# Patient Record
Sex: Female | Born: 1985 | Race: White | Hispanic: No | Marital: Married | Smoking: Never smoker
Health system: Southern US, Community
[De-identification: ages and names within clinical notes are randomized; demographics above are authoritative.]

## PROBLEM LIST (undated history)

## (undated) DIAGNOSIS — IMO0002 Reserved for concepts with insufficient information to code with codable children: Secondary | ICD-10-CM

## (undated) DIAGNOSIS — R87619 Unspecified abnormal cytological findings in specimens from cervix uteri: Secondary | ICD-10-CM

## (undated) HISTORY — DX: Reserved for concepts with insufficient information to code with codable children: IMO0002

## (undated) HISTORY — DX: Unspecified abnormal cytological findings in specimens from cervix uteri: R87.619

## (undated) HISTORY — PX: OTHER SURGICAL HISTORY: SHX169

---

## 2004-09-28 ENCOUNTER — Emergency Department (HOSPITAL_COMMUNITY): Admission: EM | Admit: 2004-09-28 | Discharge: 2004-09-28 | Payer: Self-pay

## 2005-11-30 ENCOUNTER — Emergency Department (HOSPITAL_COMMUNITY): Admission: EM | Admit: 2005-11-30 | Discharge: 2005-11-30 | Payer: Self-pay | Admitting: Emergency Medicine

## 2007-05-28 ENCOUNTER — Ambulatory Visit (HOSPITAL_COMMUNITY): Admission: RE | Admit: 2007-05-28 | Discharge: 2007-05-28 | Payer: Self-pay | Admitting: Family Medicine

## 2008-04-01 ENCOUNTER — Encounter: Admission: RE | Admit: 2008-04-01 | Discharge: 2008-04-01 | Payer: Self-pay | Admitting: Family Medicine

## 2008-05-02 ENCOUNTER — Emergency Department (HOSPITAL_COMMUNITY): Admission: EM | Admit: 2008-05-02 | Discharge: 2008-05-02 | Payer: Self-pay | Admitting: Emergency Medicine

## 2008-06-25 IMAGING — US US TRANSVAGINAL NON-OB
1 series · 14 of 25 positions shown · non-contrast
Comparison: None.

CLINICAL DATA: Pelvic pain.  
 TRANSVAGINAL PELVIC ULTRASOUND:
TECHNIQUE: Transvaginal ultrasound examination of the pelvis was performed including evaluation of the uterus, ovaries, adnexal regions, and pelvic cul-de-sac.

[Series 1: unknown · 0.32mm/px · 14 of 46 slices shown]
[im 1/46]
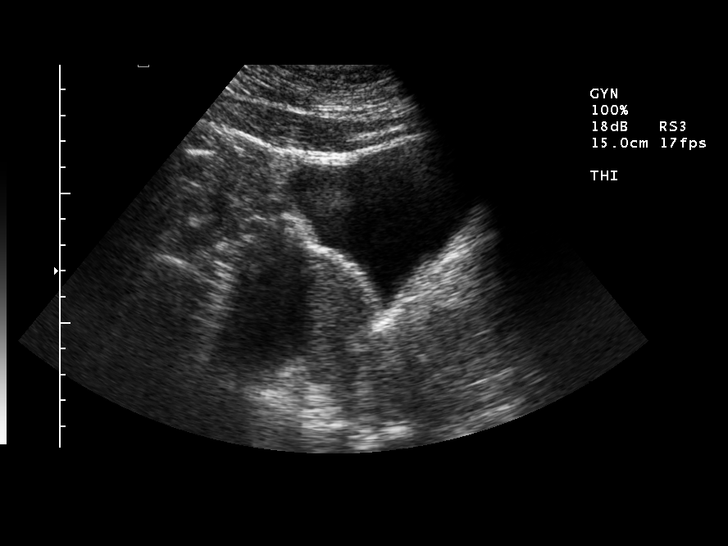
[im 4/46]
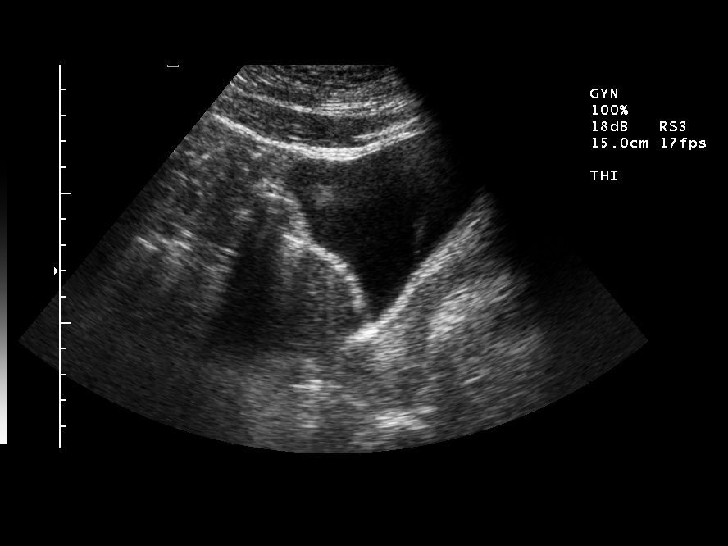
[im 8/46]
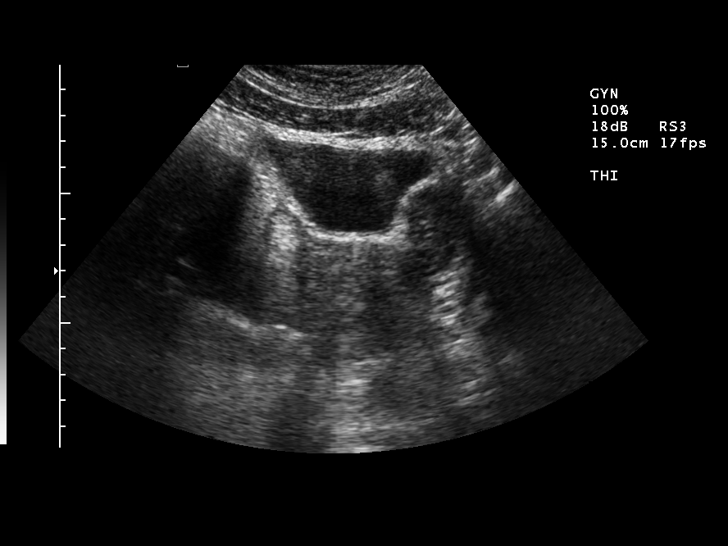
[im 12/46]
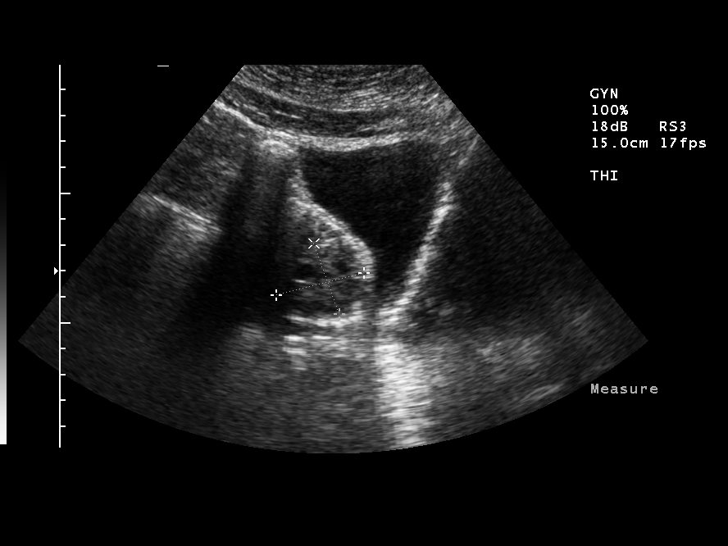
[im 16/46]
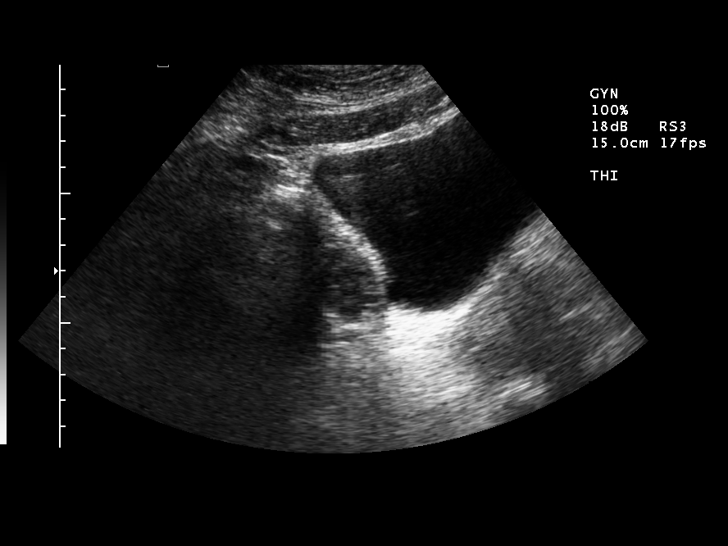
[im 17/46]
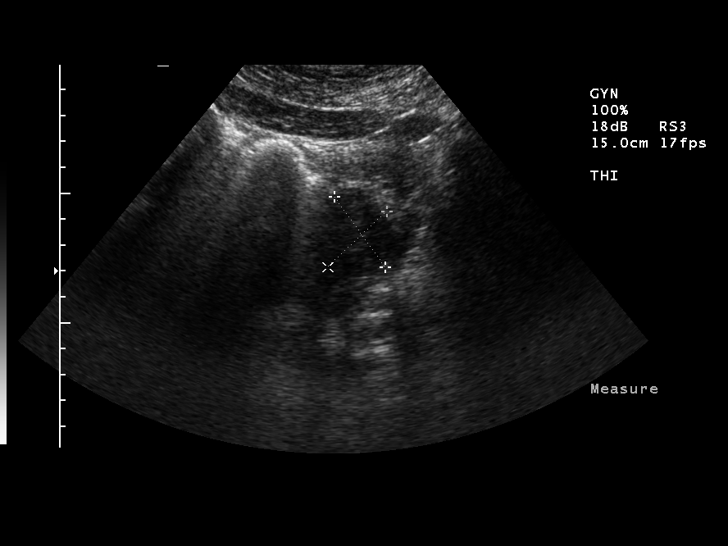
[im 21/46]
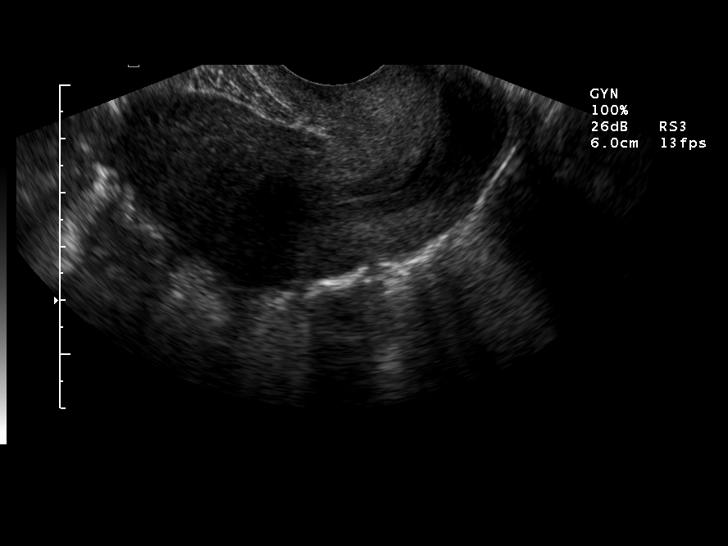
[im 25/46]
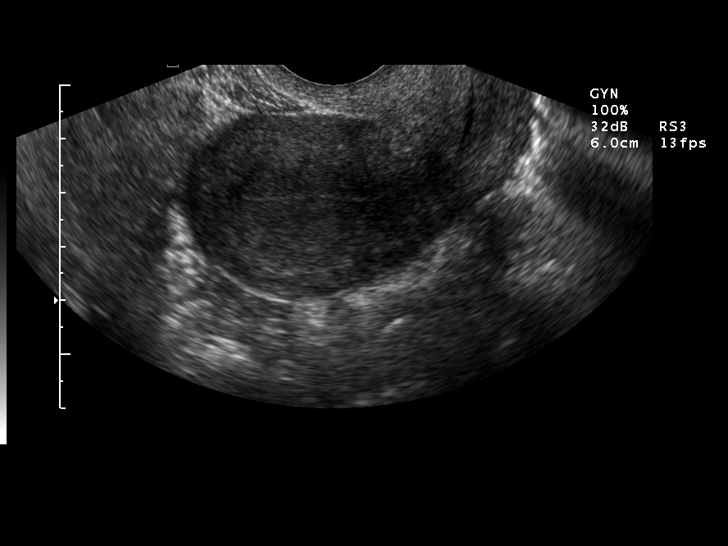
[im 29/46]
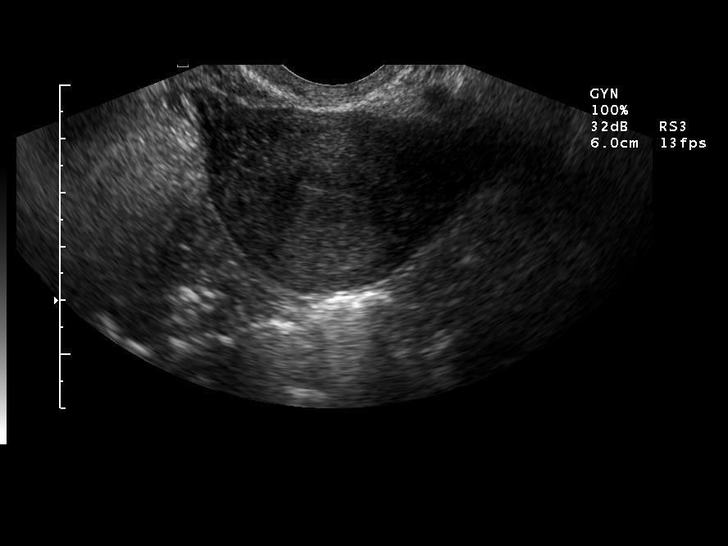
[im 31/46]
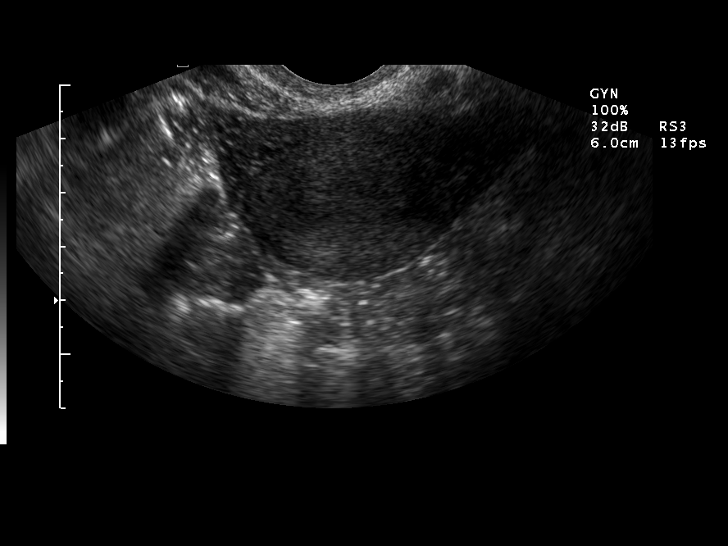
[im 34/46]
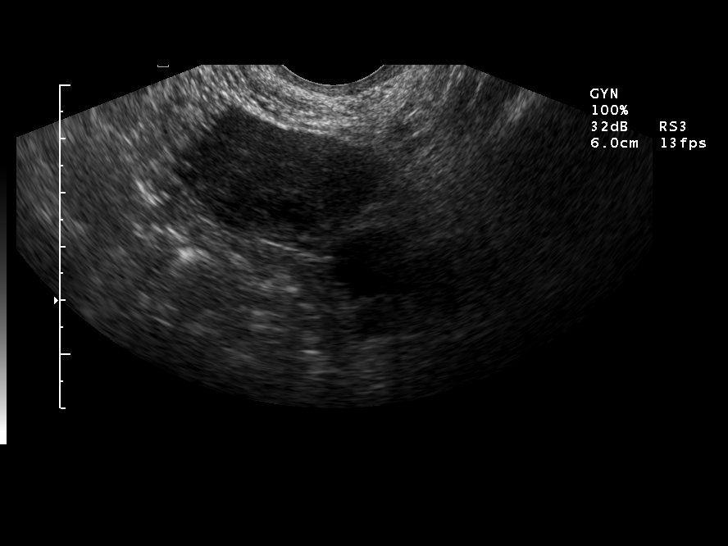
[im 38/46]
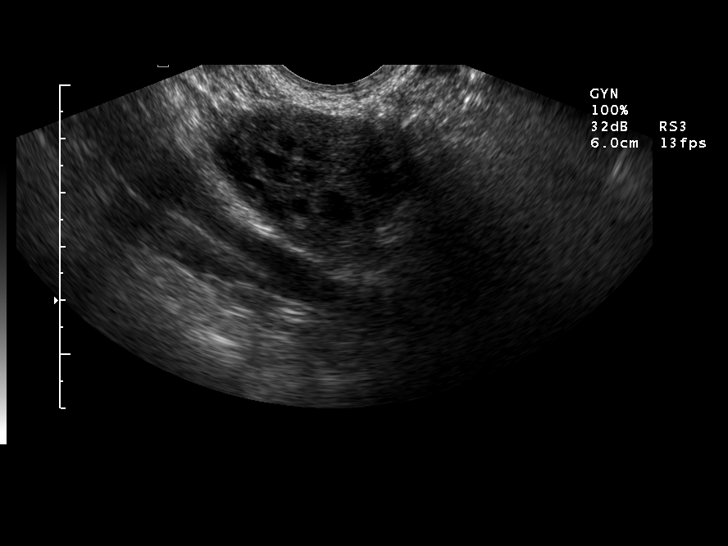
[im 42/46]
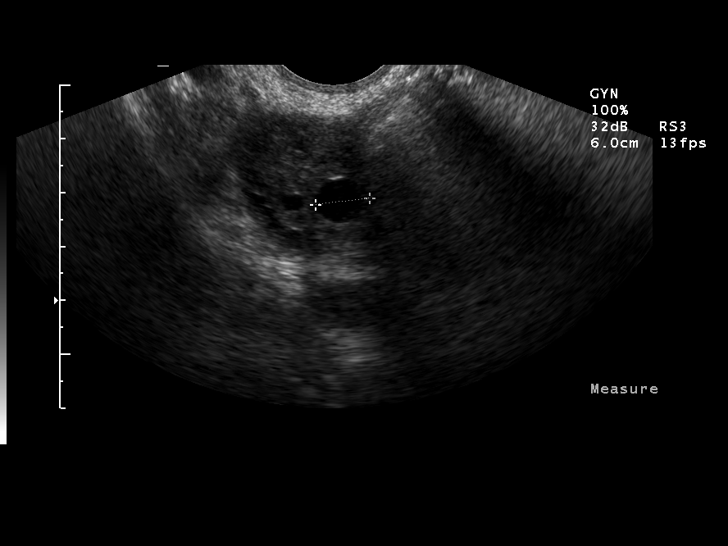
[im 46/46]
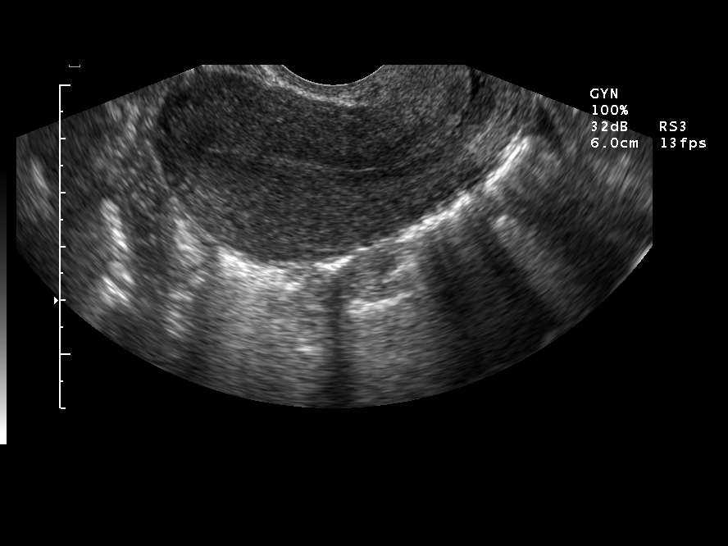

[14 of 25 positions shown; findings below may reference images not displayed]

FINDINGS: The uterine size and contour are normal.  Measurements are 5.4 x 3.6 x 4.6 cm (length x AP x width).  The endometrium is 2 mm and normal in appearance.  The ovaries are normal in size.   The right is 3.5 x 2.5 x 3.3 cm.  The left is 3.5 x 2.0 x 3.2 cm.   There are a few normal ovarian follicles.  No significant cysts or masses.    No pelvic fluid.
IMPRESSION: Normal exam.

## 2008-06-25 IMAGING — US US ABDOMEN COMPLETE
1 series · 14 of 25 positions shown · non-contrast
Comparison: None.

CLINICAL DATA: Abdominal pain radiating to back.
 ABDOMEN ULTRASOUND COMPLETE ? 05/28/07:
TECHNIQUE: Complete abdominal ultrasound examination was performed including evaluation of the liver, gallbladder, bile ducts, pancreas, kidneys, spleen, IVC, and abdominal aorta.

[Series 1: unknown · 0.25mm/px · 14 of 68 slices shown]
[im 1/68]
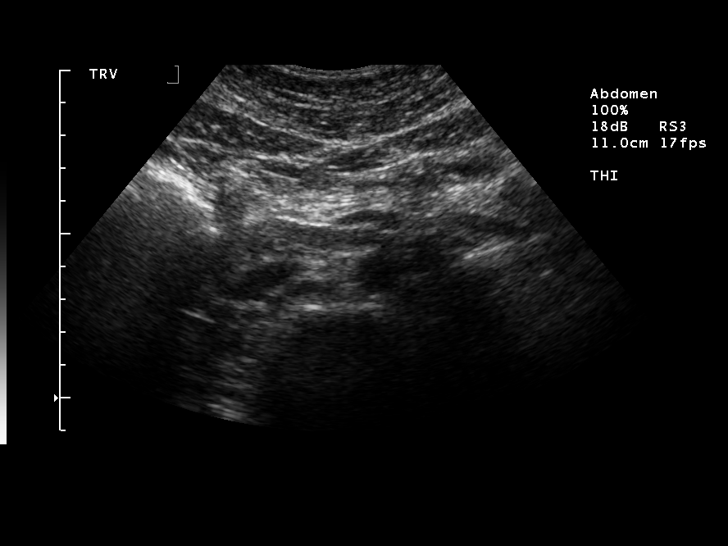
[im 6/68]
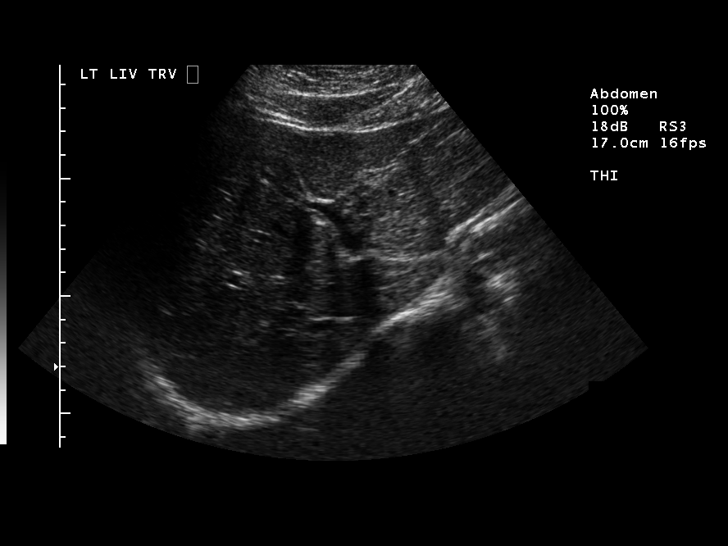
[im 12/68]
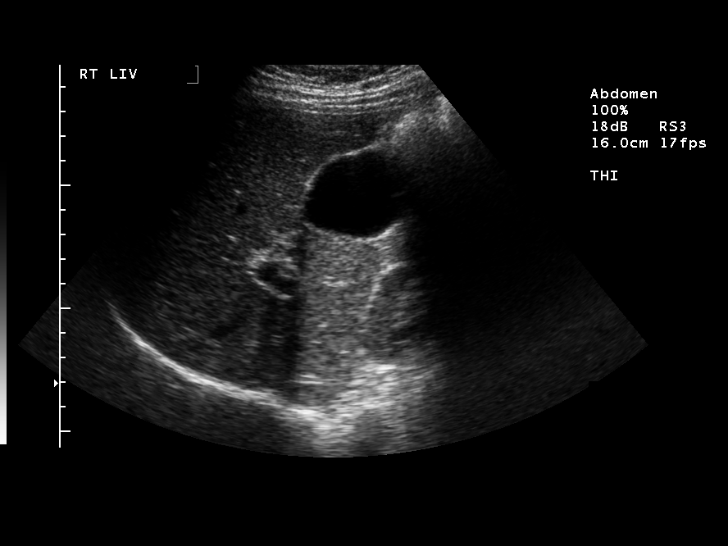
[im 17/68]
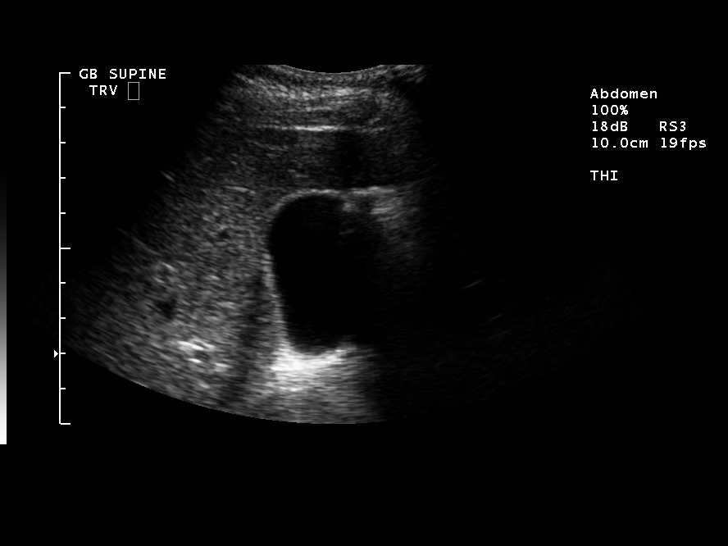
[im 23/68]
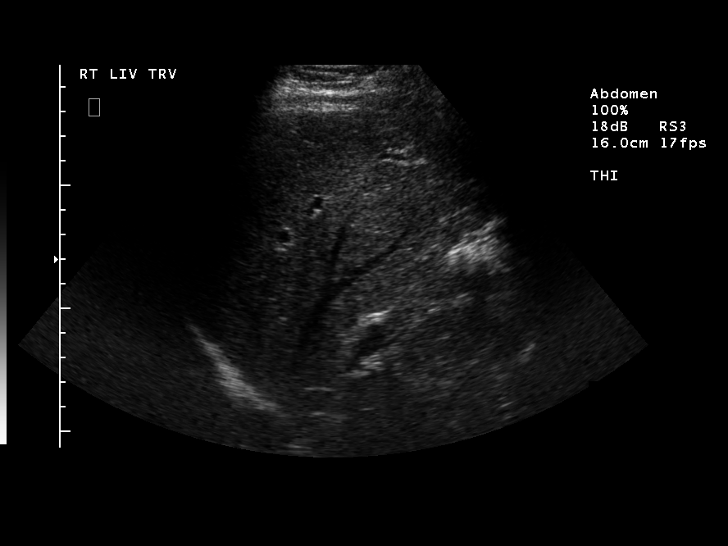
[im 26/68]
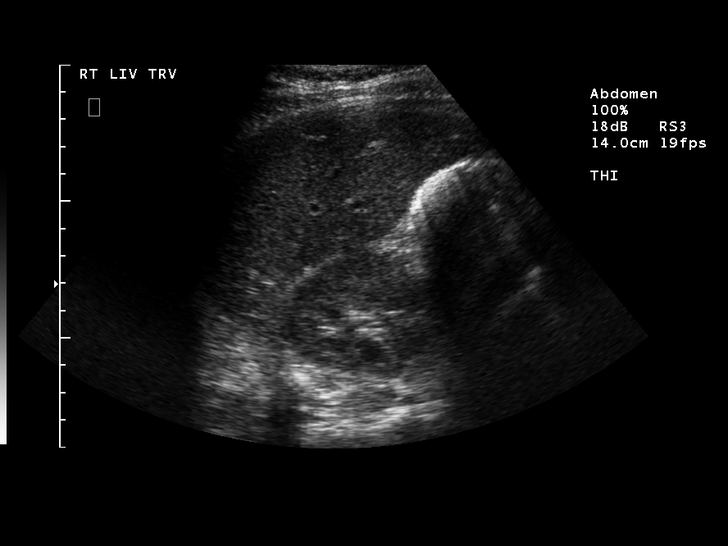
[im 31/68]
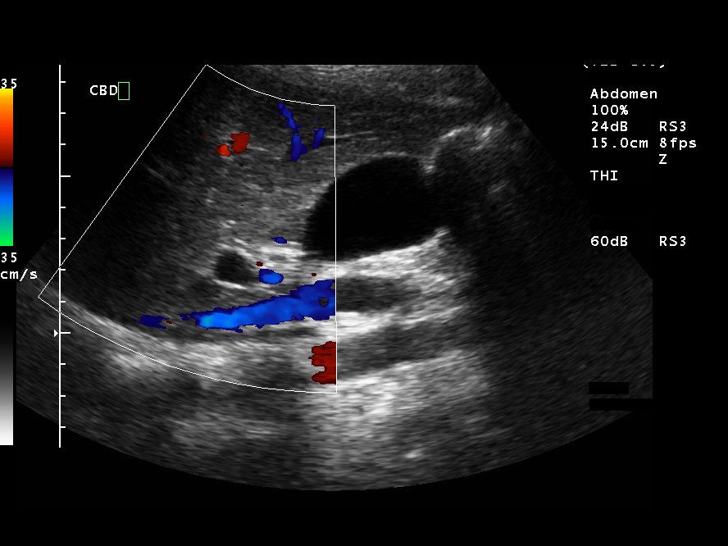
[im 37/68]
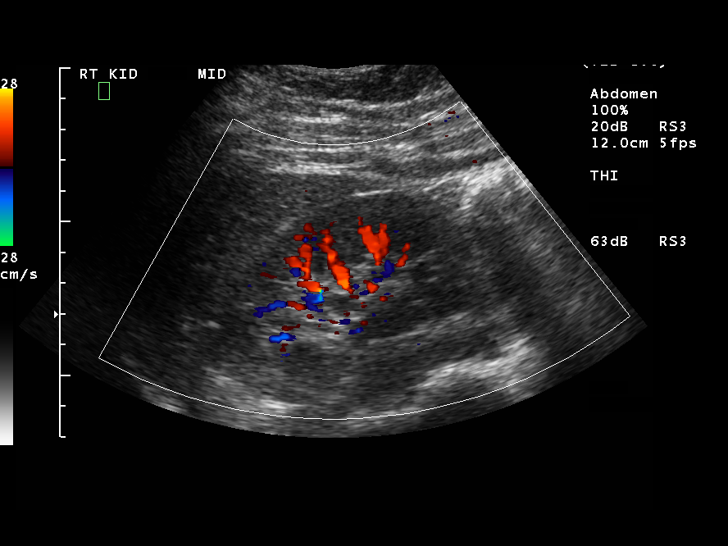
[im 42/68]
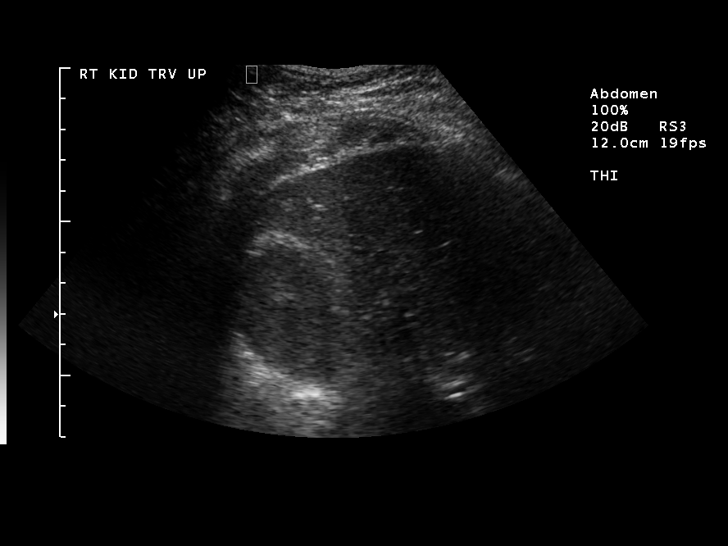
[im 45/68]
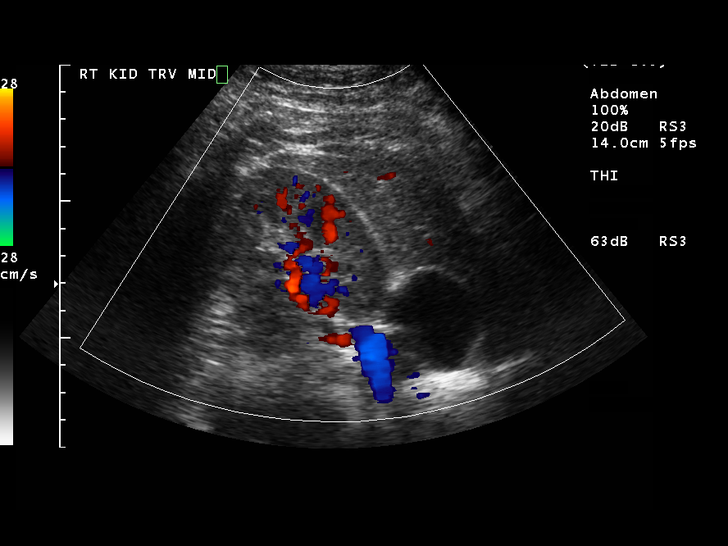
[im 51/68]
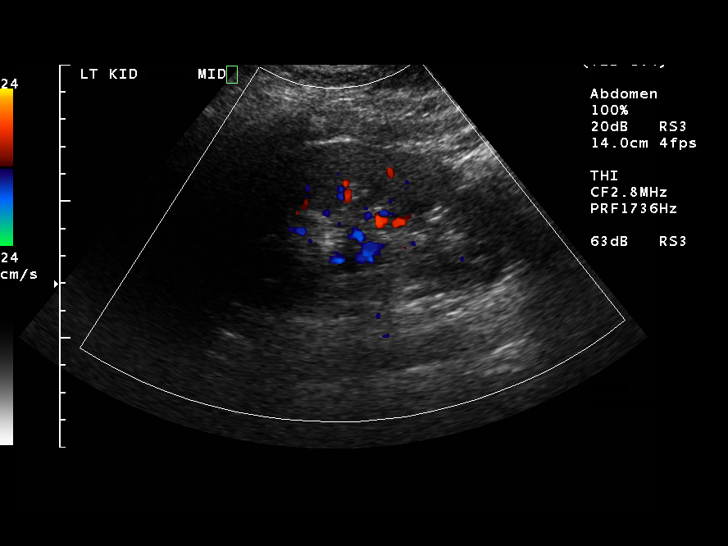
[im 56/68]
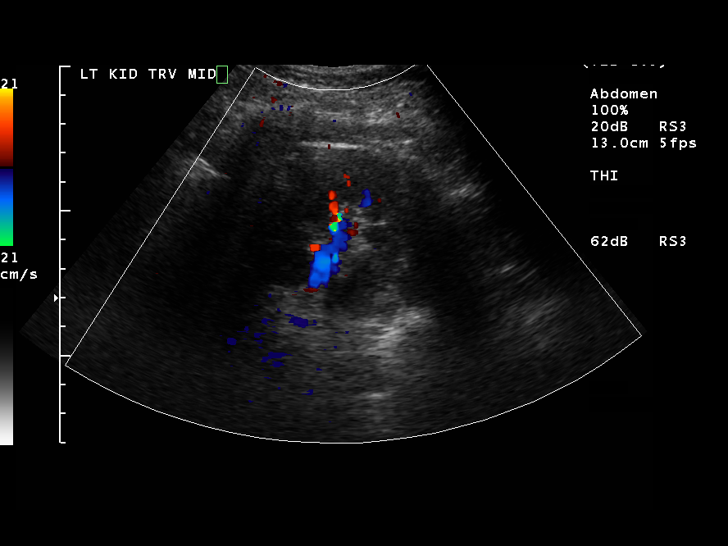
[im 62/68]
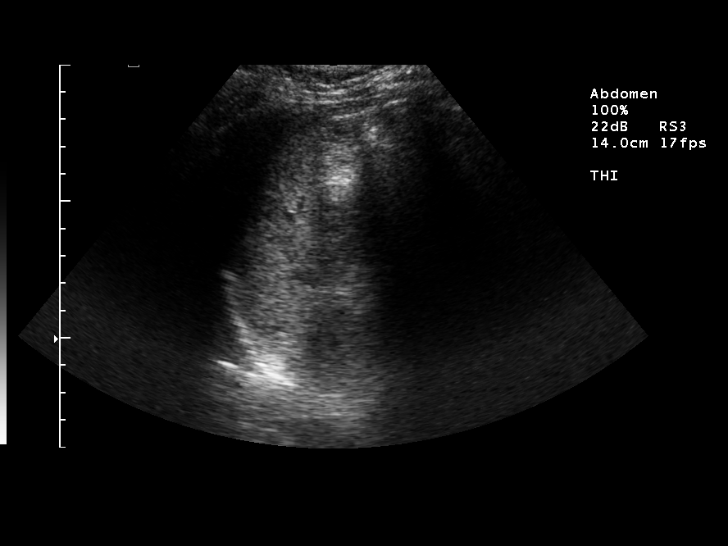
[im 68/68]
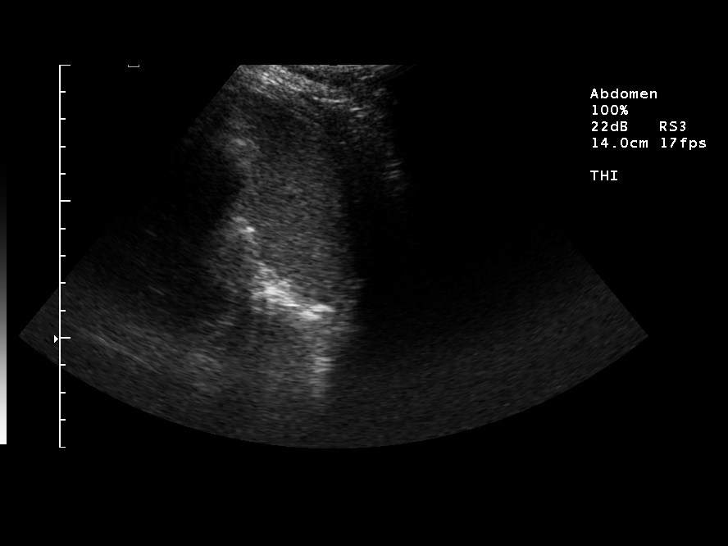

[14 of 25 positions shown; findings below may reference images not displayed]

FINDINGS: The gallbladder and biliary tree are normal.  The common duct is measured at about 2 mm.  The pancreas, liver, and spleen are normal.  The kidneys are normal without stones or obstruction.  Much of the aorta and IVC are obscured by bowel gas.  However, the pancreas is adequately demonstrated and appears normal.
IMPRESSION: 1.  Normal gallbladder and biliary tree.
 2.  No other acute or significant findings.
 3.  Much of the aorta and some of the IVC obscured by bowel gas.

## 2008-10-19 ENCOUNTER — Ambulatory Visit: Payer: Self-pay | Admitting: Family Medicine

## 2008-10-19 DIAGNOSIS — J310 Chronic rhinitis: Secondary | ICD-10-CM

## 2008-10-19 DIAGNOSIS — R51 Headache: Secondary | ICD-10-CM

## 2008-10-19 DIAGNOSIS — R519 Headache, unspecified: Secondary | ICD-10-CM | POA: Insufficient documentation

## 2009-01-31 ENCOUNTER — Encounter: Payer: Self-pay | Admitting: Obstetrics and Gynecology

## 2009-01-31 ENCOUNTER — Ambulatory Visit: Payer: Self-pay | Admitting: Obstetrics & Gynecology

## 2009-01-31 LAB — CONVERTED CEMR LAB
Antibody Screen: NEGATIVE
Basophils Absolute: 0 10*3/uL (ref 0.0–0.1)
Basophils Relative: 0 % (ref 0–1)
Eosinophils Absolute: 0 10*3/uL (ref 0.0–0.7)
Eosinophils Relative: 0 % (ref 0–5)
HCT: 35.8 % — ABNORMAL LOW (ref 36.0–46.0)
Hemoglobin: 11.9 g/dL — ABNORMAL LOW (ref 12.0–15.0)
Hepatitis B Surface Ag: NEGATIVE
Lymphocytes Relative: 25 % (ref 12–46)
Lymphs Abs: 2 10*3/uL (ref 0.7–4.0)
MCHC: 33.2 g/dL (ref 30.0–36.0)
MCV: 84.4 fL (ref 78.0–100.0)
Monocytes Absolute: 0.5 10*3/uL (ref 0.1–1.0)
Monocytes Relative: 6 % (ref 3–12)
Neutro Abs: 5.3 10*3/uL (ref 1.7–7.7)
Neutrophils Relative %: 68 % (ref 43–77)
Platelets: 237 10*3/uL (ref 150–400)
RBC: 4.24 M/uL (ref 3.87–5.11)
RDW: 14.1 % (ref 11.5–15.5)
Rh Type: NEGATIVE
Rubella: 87.5 intl units/mL — ABNORMAL HIGH
WBC: 7.9 10*3/uL (ref 4.0–10.5)

## 2009-02-01 ENCOUNTER — Encounter: Payer: Self-pay | Admitting: Obstetrics and Gynecology

## 2009-02-17 ENCOUNTER — Ambulatory Visit: Payer: Self-pay | Admitting: Family

## 2009-02-17 ENCOUNTER — Encounter: Payer: Self-pay | Admitting: Family

## 2009-03-13 ENCOUNTER — Ambulatory Visit: Payer: Self-pay | Admitting: Obstetrics and Gynecology

## 2009-04-10 ENCOUNTER — Ambulatory Visit: Payer: Self-pay | Admitting: Family

## 2009-05-08 ENCOUNTER — Ambulatory Visit: Payer: Self-pay | Admitting: Obstetrics and Gynecology

## 2009-05-12 ENCOUNTER — Ambulatory Visit (HOSPITAL_COMMUNITY): Admission: RE | Admit: 2009-05-12 | Discharge: 2009-05-12 | Payer: Self-pay | Admitting: Obstetrics & Gynecology

## 2009-05-15 ENCOUNTER — Ambulatory Visit: Payer: Self-pay | Admitting: Family

## 2009-06-05 ENCOUNTER — Ambulatory Visit: Payer: Self-pay | Admitting: Family

## 2009-07-04 ENCOUNTER — Ambulatory Visit: Payer: Self-pay | Admitting: Obstetrics & Gynecology

## 2009-07-05 ENCOUNTER — Encounter: Payer: Self-pay | Admitting: Obstetrics & Gynecology

## 2009-07-05 LAB — CONVERTED CEMR LAB
Antibody Screen: NEGATIVE
HCT: 31.1 % — ABNORMAL LOW (ref 36.0–46.0)
Hemoglobin: 10.3 g/dL — ABNORMAL LOW (ref 12.0–15.0)
MCHC: 33.1 g/dL (ref 30.0–36.0)
MCV: 84.5 fL (ref 78.0–100.0)
Platelets: 254 10*3/uL (ref 150–400)
RBC: 3.68 M/uL — ABNORMAL LOW (ref 3.87–5.11)
RDW: 14.3 % (ref 11.5–15.5)
WBC: 11 10*3/uL — ABNORMAL HIGH (ref 4.0–10.5)

## 2009-07-12 ENCOUNTER — Ambulatory Visit: Payer: Self-pay | Admitting: Obstetrics & Gynecology

## 2009-07-17 ENCOUNTER — Ambulatory Visit: Payer: Self-pay | Admitting: Physician Assistant

## 2009-07-31 ENCOUNTER — Ambulatory Visit: Payer: Self-pay | Admitting: Advanced Practice Midwife

## 2009-08-21 ENCOUNTER — Ambulatory Visit: Payer: Self-pay | Admitting: Physician Assistant

## 2009-09-04 ENCOUNTER — Ambulatory Visit: Payer: Self-pay | Admitting: Advanced Practice Midwife

## 2009-09-05 ENCOUNTER — Encounter: Payer: Self-pay | Admitting: Obstetrics and Gynecology

## 2009-09-05 LAB — CONVERTED CEMR LAB
Chlamydia, Swab/Urine, PCR: NEGATIVE
GC Probe Amp, Urine: NEGATIVE

## 2009-09-12 ENCOUNTER — Ambulatory Visit: Payer: Self-pay | Admitting: Obstetrics & Gynecology

## 2009-09-19 ENCOUNTER — Ambulatory Visit: Payer: Self-pay | Admitting: Obstetrics & Gynecology

## 2009-09-26 ENCOUNTER — Ambulatory Visit: Payer: Self-pay | Admitting: Obstetrics and Gynecology

## 2009-09-26 ENCOUNTER — Inpatient Hospital Stay (HOSPITAL_COMMUNITY): Admission: AD | Admit: 2009-09-26 | Discharge: 2009-09-26 | Payer: Self-pay | Admitting: Obstetrics & Gynecology

## 2009-10-03 ENCOUNTER — Ambulatory Visit: Payer: Self-pay | Admitting: Family Medicine

## 2009-10-05 ENCOUNTER — Encounter: Payer: Self-pay | Admitting: Obstetrics and Gynecology

## 2009-10-05 LAB — CONVERTED CEMR LAB
ALT: 11 units/L (ref 0–35)
AST: 15 units/L (ref 0–37)
Albumin: 3.5 g/dL (ref 3.5–5.2)
Alkaline Phosphatase: 127 units/L — ABNORMAL HIGH (ref 39–117)
BUN: 9 mg/dL (ref 6–23)
CO2: 20 meq/L (ref 19–32)
Calcium: 9 mg/dL (ref 8.4–10.5)
Chloride: 102 meq/L (ref 96–112)
Collection Interval-CRCL: 24 hr
Creatinine 24 HR UR: 1738 mg/24hr (ref 700–1800)
Creatinine Clearance: 175 mL/min — ABNORMAL HIGH (ref 75–115)
Creatinine, Ser: 0.69 mg/dL (ref 0.40–1.20)
Creatinine, Urine: 108.6 mg/dL
Glucose, Bld: 151 mg/dL — ABNORMAL HIGH (ref 70–99)
HCT: 34.9 % — ABNORMAL LOW (ref 36.0–46.0)
Hemoglobin: 11.2 g/dL — ABNORMAL LOW (ref 12.0–15.0)
MCHC: 32.1 g/dL (ref 30.0–36.0)
MCV: 79.3 fL (ref 78.0–100.0)
Platelets: 292 10*3/uL (ref 150–400)
Potassium: 4 meq/L (ref 3.5–5.3)
Protein, Ur: 96 mg/24hr (ref 50–100)
RBC: 4.4 M/uL (ref 3.87–5.11)
RDW: 16 % — ABNORMAL HIGH (ref 11.5–15.5)
Sodium: 135 meq/L (ref 135–145)
Total Bilirubin: 0.6 mg/dL (ref 0.3–1.2)
Total Protein: 6.3 g/dL (ref 6.0–8.3)
WBC: 10.5 10*3/uL (ref 4.0–10.5)

## 2009-10-06 ENCOUNTER — Inpatient Hospital Stay (HOSPITAL_COMMUNITY): Admission: AD | Admit: 2009-10-06 | Discharge: 2009-10-09 | Payer: Self-pay | Admitting: Obstetrics & Gynecology

## 2009-10-06 ENCOUNTER — Ambulatory Visit: Payer: Self-pay | Admitting: Advanced Practice Midwife

## 2009-10-06 ENCOUNTER — Ambulatory Visit: Payer: Self-pay | Admitting: Family

## 2009-10-18 ENCOUNTER — Ambulatory Visit: Payer: Self-pay | Admitting: Obstetrics & Gynecology

## 2009-10-23 ENCOUNTER — Ambulatory Visit: Payer: Self-pay | Admitting: Obstetrics and Gynecology

## 2009-10-25 ENCOUNTER — Ambulatory Visit: Payer: Self-pay | Admitting: Obstetrics & Gynecology

## 2009-11-15 ENCOUNTER — Ambulatory Visit: Payer: Self-pay | Admitting: Obstetrics & Gynecology

## 2010-04-02 ENCOUNTER — Ambulatory Visit: Payer: Self-pay | Admitting: Physician Assistant

## 2010-07-23 ENCOUNTER — Ambulatory Visit: Payer: Self-pay | Admitting: Obstetrics & Gynecology

## 2010-07-23 LAB — CONVERTED CEMR LAB
Antibody Screen: NEGATIVE
Bacteria, UA: NONE SEEN
Basophils Absolute: 0 10*3/uL (ref 0.0–0.1)
Basophils Relative: 0 % (ref 0–1)
Bilirubin Urine: NEGATIVE
Casts: NONE SEEN /lpf
Crystals: NONE SEEN
Eosinophils Absolute: 0.1 10*3/uL (ref 0.0–0.7)
Eosinophils Relative: 1 % (ref 0–5)
HCT: 36.3 % (ref 36.0–46.0)
Hemoglobin, Urine: NEGATIVE
Hemoglobin: 12 g/dL (ref 12.0–15.0)
Hepatitis B Surface Ag: NEGATIVE
Ketones, ur: NEGATIVE mg/dL
Leukocytes, UA: NEGATIVE
Lymphocytes Relative: 22 % (ref 12–46)
Lymphs Abs: 2 10*3/uL (ref 0.7–4.0)
MCHC: 33.1 g/dL (ref 30.0–36.0)
MCV: 81.4 fL (ref 78.0–100.0)
Monocytes Absolute: 0.5 10*3/uL (ref 0.1–1.0)
Monocytes Relative: 6 % (ref 3–12)
Neutro Abs: 6.3 10*3/uL (ref 1.7–7.7)
Neutrophils Relative %: 71 % (ref 43–77)
Nitrite: NEGATIVE
Platelets: 277 10*3/uL (ref 150–400)
Protein, ur: NEGATIVE mg/dL
RBC: 4.46 M/uL (ref 3.87–5.11)
RDW: 14.5 % (ref 11.5–15.5)
Rh Type: NEGATIVE
Rubella: 99.5 intl units/mL — ABNORMAL HIGH
Specific Gravity, Urine: 1.028 (ref 1.005–1.030)
Squamous Epithelial / HPF: NONE SEEN /lpf
Urine Glucose: NEGATIVE mg/dL
Urobilinogen, UA: 0.2 (ref 0.0–1.0)
WBC: 8.9 10*3/uL (ref 4.0–10.5)
pH: 6 (ref 5.0–8.0)

## 2010-08-01 ENCOUNTER — Inpatient Hospital Stay (HOSPITAL_COMMUNITY): Admission: AD | Admit: 2010-08-01 | Discharge: 2010-08-01 | Payer: Self-pay | Admitting: Obstetrics & Gynecology

## 2010-08-20 ENCOUNTER — Encounter: Payer: Self-pay | Admitting: Obstetrics and Gynecology

## 2010-08-20 ENCOUNTER — Ambulatory Visit: Payer: Self-pay | Admitting: Advanced Practice Midwife

## 2010-08-20 LAB — CONVERTED CEMR LAB
Chlamydia, Swab/Urine, PCR: NEGATIVE
GC Probe Amp, Urine: NEGATIVE

## 2010-09-17 ENCOUNTER — Ambulatory Visit: Payer: Self-pay | Admitting: Obstetrics & Gynecology

## 2010-10-04 ENCOUNTER — Ambulatory Visit (HOSPITAL_COMMUNITY)
Admission: RE | Admit: 2010-10-04 | Discharge: 2010-10-04 | Payer: Self-pay | Source: Home / Self Care | Attending: Family Medicine | Admitting: Family Medicine

## 2010-10-04 ENCOUNTER — Encounter: Payer: Self-pay | Admitting: Family Medicine

## 2010-10-15 ENCOUNTER — Ambulatory Visit: Payer: Self-pay | Admitting: Advanced Practice Midwife

## 2010-11-12 ENCOUNTER — Ambulatory Visit
Admission: RE | Admit: 2010-11-12 | Discharge: 2010-11-12 | Payer: Self-pay | Source: Home / Self Care | Attending: Obstetrics & Gynecology | Admitting: Obstetrics & Gynecology

## 2010-11-19 ENCOUNTER — Encounter: Payer: Self-pay | Admitting: Family Medicine

## 2010-12-14 ENCOUNTER — Institutional Professional Consult (permissible substitution): Payer: Self-pay

## 2010-12-14 ENCOUNTER — Encounter: Payer: Self-pay | Admitting: Family

## 2010-12-14 DIAGNOSIS — O361190 Maternal care for Anti-A sensitization, unspecified trimester, not applicable or unspecified: Secondary | ICD-10-CM

## 2010-12-14 DIAGNOSIS — Z3480 Encounter for supervision of other normal pregnancy, unspecified trimester: Secondary | ICD-10-CM

## 2010-12-14 LAB — CONVERTED CEMR LAB
Antibody Screen: NEGATIVE
HCT: 34.3 % — ABNORMAL LOW (ref 36.0–46.0)
Hemoglobin: 10.8 g/dL — ABNORMAL LOW (ref 12.0–15.0)
MCHC: 31.5 g/dL (ref 30.0–36.0)
MCV: 85.8 fL (ref 78.0–100.0)
Platelets: 251 10*3/uL (ref 150–400)
RBC: 4 M/uL (ref 3.87–5.11)
RDW: 15.4 % (ref 11.5–15.5)
WBC: 8.7 10*3/uL (ref 4.0–10.5)

## 2010-12-31 ENCOUNTER — Other Ambulatory Visit: Payer: Self-pay | Admitting: Obstetrics & Gynecology

## 2010-12-31 DIAGNOSIS — N312 Flaccid neuropathic bladder, not elsewhere classified: Secondary | ICD-10-CM

## 2010-12-31 DIAGNOSIS — Z3689 Encounter for other specified antenatal screening: Secondary | ICD-10-CM

## 2010-12-31 DIAGNOSIS — O3430 Maternal care for cervical incompetence, unspecified trimester: Secondary | ICD-10-CM

## 2010-12-31 DIAGNOSIS — R87619 Unspecified abnormal cytological findings in specimens from cervix uteri: Secondary | ICD-10-CM

## 2010-12-31 DIAGNOSIS — Z3480 Encounter for supervision of other normal pregnancy, unspecified trimester: Secondary | ICD-10-CM

## 2011-01-04 ENCOUNTER — Encounter (HOSPITAL_COMMUNITY): Payer: Self-pay

## 2011-01-04 ENCOUNTER — Other Ambulatory Visit: Payer: Self-pay | Admitting: Obstetrics & Gynecology

## 2011-01-04 ENCOUNTER — Ambulatory Visit (HOSPITAL_COMMUNITY)
Admission: RE | Admit: 2011-01-04 | Discharge: 2011-01-04 | Disposition: A | Discharge status: Home / Self Care | Payer: 59 | Source: Ambulatory Visit | Attending: Obstetrics & Gynecology | Admitting: Obstetrics & Gynecology

## 2011-01-04 DIAGNOSIS — O3430 Maternal care for cervical incompetence, unspecified trimester: Secondary | ICD-10-CM

## 2011-01-04 DIAGNOSIS — Z3689 Encounter for other specified antenatal screening: Secondary | ICD-10-CM | POA: Insufficient documentation

## 2011-01-05 ENCOUNTER — Encounter: Payer: Self-pay | Admitting: Physician Assistant

## 2011-01-05 LAB — CONVERTED CEMR LAB
ALT: 8 units/L (ref 0–35)
AST: 10 units/L (ref 0–37)
Albumin: 3.4 g/dL — ABNORMAL LOW (ref 3.5–5.2)
Alkaline Phosphatase: 65 units/L (ref 39–117)
BUN: 9 mg/dL (ref 6–23)
Basophils Absolute: 0 10*3/uL (ref 0.0–0.1)
Basophils Relative: 0 % (ref 0–1)
CO2: 21 meq/L (ref 19–32)
Calcium: 8.7 mg/dL (ref 8.4–10.5)
Chloride: 105 meq/L (ref 96–112)
Collection Interval-CRCL: 24 hr
Creatinine, Ser: 0.63 mg/dL (ref 0.40–1.20)
Creatinine, Urine: 99.9 mg/dL
Eosinophils Absolute: 0 10*3/uL (ref 0.0–0.7)
Glucose, Bld: 103 mg/dL — ABNORMAL HIGH (ref 70–99)
HCT: 34.2 % — ABNORMAL LOW (ref 36.0–46.0)
Hemoglobin: 11 g/dL — ABNORMAL LOW (ref 12.0–15.0)
Lymphocytes Relative: 17 % (ref 12–46)
Lymphs Abs: 1.6 10*3/uL (ref 0.7–4.0)
MCV: 82.6 fL (ref 78.0–100.0)
Monocytes Absolute: 0.6 10*3/uL (ref 0.1–1.0)
Monocytes Relative: 6 % (ref 3–12)
Neutro Abs: 7 10*3/uL (ref 1.7–7.7)
Neutrophils Relative %: 76 % (ref 43–77)
Platelets: 239 10*3/uL (ref 150–400)
Potassium: 4.1 meq/L (ref 3.5–5.3)
RBC: 4.14 M/uL (ref 3.87–5.11)
RDW: 15.8 % — ABNORMAL HIGH (ref 11.5–15.5)
Sodium: 137 meq/L (ref 135–145)
Total Bilirubin: 0.3 mg/dL (ref 0.3–1.2)
Total Protein: 5.6 g/dL — ABNORMAL LOW (ref 6.0–8.3)
Uric Acid, Serum: 2.9 mg/dL (ref 2.4–7.0)
WBC: 9.1 10*3/uL (ref 4.0–10.5)

## 2011-01-10 LAB — URINE MICROSCOPIC-ADD ON

## 2011-01-10 LAB — URINALYSIS, ROUTINE W REFLEX MICROSCOPIC
Bilirubin Urine: NEGATIVE
Glucose, UA: NEGATIVE mg/dL
Ketones, ur: 15 mg/dL — AB
Leukocytes, UA: NEGATIVE
Nitrite: NEGATIVE
Protein, ur: NEGATIVE mg/dL
Specific Gravity, Urine: 1.025 (ref 1.005–1.030)
Urobilinogen, UA: 0.2 mg/dL (ref 0.0–1.0)
pH: 6 (ref 5.0–8.0)

## 2011-01-10 LAB — COMPREHENSIVE METABOLIC PANEL
ALT: 13 U/L (ref 0–35)
AST: 15 U/L (ref 0–37)
Albumin: 3.4 g/dL — ABNORMAL LOW (ref 3.5–5.2)
Alkaline Phosphatase: 51 U/L (ref 39–117)
BUN: 8 mg/dL (ref 6–23)
CO2: 25 mEq/L (ref 19–32)
Calcium: 8.9 mg/dL (ref 8.4–10.5)
Chloride: 101 mEq/L (ref 96–112)
Creatinine, Ser: 0.63 mg/dL (ref 0.4–1.2)
GFR calc Af Amer: >60 mL/min (ref >60)
GFR calc non Af Amer: >60 mL/min (ref >60)
Glucose, Bld: 93 mg/dL (ref 70–99)
Potassium: 4.1 mEq/L (ref 3.5–5.1)
Sodium: 133 mEq/L — ABNORMAL LOW (ref 135–145)
Total Bilirubin: 0.4 mg/dL (ref 0.3–1.2)
Total Protein: 6.2 g/dL (ref 6.0–8.3)

## 2011-01-14 DIAGNOSIS — Z3480 Encounter for supervision of other normal pregnancy, unspecified trimester: Secondary | ICD-10-CM

## 2011-01-14 DIAGNOSIS — R87619 Unspecified abnormal cytological findings in specimens from cervix uteri: Secondary | ICD-10-CM

## 2011-01-14 DIAGNOSIS — N312 Flaccid neuropathic bladder, not elsewhere classified: Secondary | ICD-10-CM

## 2011-01-22 ENCOUNTER — Encounter: Payer: 59 | Admitting: Obstetrics & Gynecology

## 2011-01-22 ENCOUNTER — Other Ambulatory Visit: Payer: Self-pay | Admitting: Family Medicine

## 2011-01-22 DIAGNOSIS — O4190X0 Disorder of amniotic fluid and membranes, unspecified, unspecified trimester, not applicable or unspecified: Secondary | ICD-10-CM

## 2011-01-22 DIAGNOSIS — O169 Unspecified maternal hypertension, unspecified trimester: Secondary | ICD-10-CM

## 2011-01-22 DIAGNOSIS — Z3480 Encounter for supervision of other normal pregnancy, unspecified trimester: Secondary | ICD-10-CM

## 2011-01-25 ENCOUNTER — Other Ambulatory Visit: Payer: Self-pay | Admitting: Family Medicine

## 2011-01-25 ENCOUNTER — Other Ambulatory Visit: Payer: 59

## 2011-01-25 ENCOUNTER — Ambulatory Visit (HOSPITAL_COMMUNITY)
Admission: RE | Admit: 2011-01-25 | Discharge: 2011-01-25 | Disposition: A | Discharge status: Home / Self Care | Payer: 59 | Source: Ambulatory Visit | Attending: Family Medicine | Admitting: Family Medicine

## 2011-01-25 DIAGNOSIS — Z3689 Encounter for other specified antenatal screening: Secondary | ICD-10-CM | POA: Insufficient documentation

## 2011-01-25 DIAGNOSIS — O169 Unspecified maternal hypertension, unspecified trimester: Secondary | ICD-10-CM

## 2011-01-25 DIAGNOSIS — O4190X0 Disorder of amniotic fluid and membranes, unspecified, unspecified trimester, not applicable or unspecified: Secondary | ICD-10-CM

## 2011-01-25 DIAGNOSIS — O0990 Supervision of high risk pregnancy, unspecified, unspecified trimester: Secondary | ICD-10-CM

## 2011-01-25 DIAGNOSIS — O139 Gestational [pregnancy-induced] hypertension without significant proteinuria, unspecified trimester: Secondary | ICD-10-CM | POA: Insufficient documentation

## 2011-01-28 DIAGNOSIS — O169 Unspecified maternal hypertension, unspecified trimester: Secondary | ICD-10-CM

## 2011-01-28 DIAGNOSIS — Z3480 Encounter for supervision of other normal pregnancy, unspecified trimester: Secondary | ICD-10-CM

## 2011-01-29 LAB — COMPREHENSIVE METABOLIC PANEL
ALT: 16 U/L (ref 0–35)
AST: 22 U/L (ref 0–37)
Albumin: 2.8 g/dL — ABNORMAL LOW (ref 3.5–5.2)
Alkaline Phosphatase: 128 U/L — ABNORMAL HIGH (ref 39–117)
BUN: 7 mg/dL (ref 6–23)
CO2: 22 mEq/L (ref 19–32)
Calcium: 9.4 mg/dL (ref 8.4–10.5)
Chloride: 102 mEq/L (ref 96–112)
Creatinine, Ser: 0.6 mg/dL (ref 0.4–1.2)
GFR calc Af Amer: >60 mL/min (ref >60)
GFR calc non Af Amer: >60 mL/min (ref >60)
Glucose, Bld: 89 mg/dL (ref 70–99)
Potassium: 4.3 mEq/L (ref 3.5–5.1)
Sodium: 133 mEq/L — ABNORMAL LOW (ref 135–145)
Total Bilirubin: 0.4 mg/dL (ref 0.3–1.2)
Total Protein: 5.9 g/dL — ABNORMAL LOW (ref 6.0–8.3)

## 2011-01-29 LAB — RH IMMUNE GLOB WKUP(>/=20WKS)(NOT WOMEN'S HOSP): Fetal Screen: NEGATIVE

## 2011-01-29 LAB — CBC
HCT: 33.7 % — ABNORMAL LOW (ref 36.0–46.0)
Hemoglobin: 11 g/dL — ABNORMAL LOW (ref 12.0–15.0)
MCHC: 32.7 g/dL (ref 30.0–36.0)
MCV: 78.8 fL (ref 78.0–100.0)
Platelets: 356 10*3/uL (ref 150–400)
RBC: 4.27 MIL/uL (ref 3.87–5.11)
RDW: 17.2 % — ABNORMAL HIGH (ref 11.5–15.5)
WBC: 9.8 10*3/uL (ref 4.0–10.5)

## 2011-01-29 LAB — RPR: RPR Ser Ql: NONREACTIVE

## 2011-01-31 ENCOUNTER — Other Ambulatory Visit: Payer: 59

## 2011-01-31 DIAGNOSIS — O169 Unspecified maternal hypertension, unspecified trimester: Secondary | ICD-10-CM

## 2011-02-01 ENCOUNTER — Ambulatory Visit (HOSPITAL_COMMUNITY)
Admission: RE | Admit: 2011-02-01 | Discharge: 2011-02-01 | Disposition: A | Discharge status: Home / Self Care | Payer: 59 | Source: Ambulatory Visit | Attending: Family Medicine | Admitting: Family Medicine

## 2011-02-01 DIAGNOSIS — O4190X0 Disorder of amniotic fluid and membranes, unspecified, unspecified trimester, not applicable or unspecified: Secondary | ICD-10-CM

## 2011-02-01 DIAGNOSIS — O139 Gestational [pregnancy-induced] hypertension without significant proteinuria, unspecified trimester: Secondary | ICD-10-CM | POA: Insufficient documentation

## 2011-02-04 ENCOUNTER — Inpatient Hospital Stay (HOSPITAL_COMMUNITY)
Admission: AD | Admit: 2011-02-04 | Discharge: 2011-02-04 | Disposition: A | Discharge status: Home / Self Care | Payer: 59 | Source: Ambulatory Visit | Attending: Family Medicine | Admitting: Family Medicine

## 2011-02-04 ENCOUNTER — Inpatient Hospital Stay (HOSPITAL_COMMUNITY): Payer: 59

## 2011-02-04 DIAGNOSIS — O169 Unspecified maternal hypertension, unspecified trimester: Secondary | ICD-10-CM

## 2011-02-04 DIAGNOSIS — Z3480 Encounter for supervision of other normal pregnancy, unspecified trimester: Secondary | ICD-10-CM

## 2011-02-04 DIAGNOSIS — O368390 Maternal care for abnormalities of the fetal heart rate or rhythm, unspecified trimester, not applicable or unspecified: Secondary | ICD-10-CM | POA: Insufficient documentation

## 2011-02-07 ENCOUNTER — Other Ambulatory Visit: Payer: 59

## 2011-02-07 ENCOUNTER — Inpatient Hospital Stay (HOSPITAL_COMMUNITY)
Admission: AD | Admit: 2011-02-07 | Discharge: 2011-02-07 | Disposition: A | Discharge status: Home / Self Care | Payer: 59 | Source: Ambulatory Visit | Attending: Obstetrics & Gynecology | Admitting: Obstetrics & Gynecology

## 2011-02-07 DIAGNOSIS — O228X9 Other venous complications in pregnancy, unspecified trimester: Secondary | ICD-10-CM | POA: Insufficient documentation

## 2011-02-07 DIAGNOSIS — K649 Unspecified hemorrhoids: Secondary | ICD-10-CM | POA: Insufficient documentation

## 2011-02-07 DIAGNOSIS — O10019 Pre-existing essential hypertension complicating pregnancy, unspecified trimester: Secondary | ICD-10-CM | POA: Insufficient documentation

## 2011-02-08 ENCOUNTER — Ambulatory Visit (HOSPITAL_COMMUNITY): Payer: 59

## 2011-02-11 ENCOUNTER — Ambulatory Visit (HOSPITAL_COMMUNITY)
Admission: RE | Admit: 2011-02-11 | Discharge: 2011-02-11 | Disposition: A | Discharge status: Home / Self Care | Payer: 59 | Source: Ambulatory Visit | Attending: Family Medicine | Admitting: Family Medicine

## 2011-02-11 DIAGNOSIS — O169 Unspecified maternal hypertension, unspecified trimester: Secondary | ICD-10-CM

## 2011-02-11 DIAGNOSIS — Z3689 Encounter for other specified antenatal screening: Secondary | ICD-10-CM | POA: Insufficient documentation

## 2011-02-11 DIAGNOSIS — O4190X0 Disorder of amniotic fluid and membranes, unspecified, unspecified trimester, not applicable or unspecified: Secondary | ICD-10-CM

## 2011-02-11 DIAGNOSIS — O139 Gestational [pregnancy-induced] hypertension without significant proteinuria, unspecified trimester: Secondary | ICD-10-CM | POA: Insufficient documentation

## 2011-02-11 DIAGNOSIS — Z3480 Encounter for supervision of other normal pregnancy, unspecified trimester: Secondary | ICD-10-CM

## 2011-02-13 ENCOUNTER — Other Ambulatory Visit: Payer: Self-pay | Admitting: Family Medicine

## 2011-02-13 DIAGNOSIS — I10 Essential (primary) hypertension: Secondary | ICD-10-CM

## 2011-02-14 ENCOUNTER — Other Ambulatory Visit: Payer: 59

## 2011-02-14 DIAGNOSIS — O169 Unspecified maternal hypertension, unspecified trimester: Secondary | ICD-10-CM

## 2011-02-18 ENCOUNTER — Ambulatory Visit (HOSPITAL_COMMUNITY)
Admission: RE | Admit: 2011-02-18 | Discharge: 2011-02-18 | Disposition: A | Discharge status: Home / Self Care | Payer: 59 | Source: Ambulatory Visit | Attending: Family Medicine | Admitting: Family Medicine

## 2011-02-18 DIAGNOSIS — I10 Essential (primary) hypertension: Secondary | ICD-10-CM

## 2011-02-18 DIAGNOSIS — O139 Gestational [pregnancy-induced] hypertension without significant proteinuria, unspecified trimester: Secondary | ICD-10-CM | POA: Insufficient documentation

## 2011-02-18 DIAGNOSIS — Z3480 Encounter for supervision of other normal pregnancy, unspecified trimester: Secondary | ICD-10-CM

## 2011-02-18 DIAGNOSIS — Z3689 Encounter for other specified antenatal screening: Secondary | ICD-10-CM | POA: Insufficient documentation

## 2011-02-18 DIAGNOSIS — O169 Unspecified maternal hypertension, unspecified trimester: Secondary | ICD-10-CM

## 2011-02-21 ENCOUNTER — Other Ambulatory Visit (INDEPENDENT_AMBULATORY_CARE_PROVIDER_SITE_OTHER): Payer: 59

## 2011-02-21 DIAGNOSIS — O169 Unspecified maternal hypertension, unspecified trimester: Secondary | ICD-10-CM

## 2011-02-25 ENCOUNTER — Inpatient Hospital Stay (HOSPITAL_COMMUNITY)
Admission: RE | Admit: 2011-02-25 | Discharge: 2011-02-27 | DRG: 774 | Disposition: A | Discharge status: Home / Self Care | Payer: 59 | Source: Ambulatory Visit | Attending: Obstetrics & Gynecology | Admitting: Obstetrics & Gynecology

## 2011-02-25 DIAGNOSIS — O1002 Pre-existing essential hypertension complicating childbirth: Principal | ICD-10-CM | POA: Diagnosis present

## 2011-02-25 LAB — CBC
HCT: 34.9 % — ABNORMAL LOW (ref 36.0–46.0)
MCH: 26 pg (ref 26.0–34.0)
MCV: 80.2 fL (ref 78.0–100.0)
RDW: 16.1 % — ABNORMAL HIGH (ref 11.5–15.5)
WBC: 8.8 10*3/uL (ref 4.0–10.5)

## 2011-02-26 DIAGNOSIS — O1002 Pre-existing essential hypertension complicating childbirth: Secondary | ICD-10-CM

## 2011-03-04 LAB — RH IMMUNE GLOB WKUP(>/=20WKS)(NOT WOMEN'S HOSP)
Fetal Screen: NEGATIVE
Unit division: 0

## 2011-03-12 NOTE — Assessment & Plan Note (Signed)
NAMENERIYAH, CERCONE           ACCOUNT NO.:  [id number]   MEDICAL RECORD NO.:  [id number]          PATIENT TYPE:  POB   LOCATION:  [facility] at [place]         FACILITY:  [facility]   PHYSICIAN:  Elsie Lincoln, MD      DATE OF BIRTH:  03/01/1986   DATE OF SERVICE:  10/18/2009                                  CLINIC NOTE   The patient is a 25 year old female, who had an NSVD as a 8-1/2 pound  infant about a week and half ago.  The patient has secondary laceration.  The patient's postpartum course in the hospital was complicated with  unable to urinate after the Foley was removed.  She was cast in and out  once at the hospital.  She was able to void after that.  The patient has  subsequently had to urinate at home and this morning she was not able to  urinate since she has awaken.  Also of note, she says she has had foul-  smelling discharge and which she feels as lumps inside of her labia.  She also had a fever Monday of 102, but none since today.  Her  temperature is 97 degrees.  She denies any breast pain, chest pain,  shortness of breath.  She has not had sexual intercourse.  She has not  had any major heavy vaginal bleeding.  The patient says breast-feeding  is going well.   PHYSICAL EXAMINATION:  VITAL SIGNS:  Temperature 97, pulse 91, blood  pressure 131/84, weight 205, height 64 inches.  GENERAL:  Well-nourished, well-developed, no apparent distress.  HEENT:  Normocephalic, atraumatic.  BREASTS:  Nontender.  No erythema and no nodularity.  Nipples no cracks  or evidence of yeast.  ABDOMEN:  Soft, nontender.  Fundus slightly above umbilicus.  GENITALIA:  The laceration repair has broken down slightly on the right  hand side.  There are portions of the Vicryl that can be seen.  The  sphincter is intact.  The patient is slightly tender in this area, where  the laceration is broken down.  There is no bleeding.  There is no  evidence of active infection.  I do see some very small  bumps on the  labia, but these do not resemble herpes.  She is not tender over this  area.  The vagina is pink with some watery discharge in the vagina with  some odor.  The cervix is closed.  No laceration.  Uterus is slightly  more enlarged then I was thought we could have postpartum, but due to  the bladder being enlarged, the patient is nontender over the uterus.  No CMT.  The patient is mildly tender over the enlarged bladder.   ASSESSMENT AND PLAN:  A 25 year old female postpartum we can have with:  1. A postvoiding residual of greater than 500 mL.  The patient has an      indwelling catheter and will keep this until for 5 days.  At this      time, she will return to the [facility], and we will      remove the Foley and hopefully the bladder will have rested and she      will be  able to void on her own.  2. We will treat empirically for mild endometritis and also possible      early infection at this broken down laceration.  We will treat with      Augmentin 875 mg p.o. b.i.d.  This will also serve as antimicrobial      prophylaxis with an indwelling catheter.  3. Constipation.  The patient is on MiraLax and Colace.  The patient      should continue this and will cut back as necessary as Augmentin      may cause diarrhea.  4. Continue breast-feeding.  Augmentin is compatible with breast-      feeding.  5. The patient given signs for infection and when to call the office      or come to the MAU as necessary.           ______________________________  Elsie Lincoln, MD     KL/MEDQ  D:  10/18/2009  T:  10/19/2009  Job:  [id number]

## 2011-03-12 NOTE — Assessment & Plan Note (Signed)
[name], [name]           ACCOUNT NO.:  [id number]   MEDICAL RECORD NO.:  [id number]          PATIENT TYPE:  POB   LOCATION:  [facility] at [place]         FACILITY:  [facility]   PHYSICIAN:  Allie Bossier, MD        DATE OF BIRTH:  Dec 24, 1985   DATE OF SERVICE:  10/25/2009                                  CLINIC NOTE   [name] is a 25 year old G1, P1 with a 74-1/2-week-old baby.  She is  here for followup of her second-degree laceration and urinary  difficulties that required placement of a Foley catheter.  She has had  her catheter out for the last 2 days.  She has finished her Augmentin  today and has no complaints.  She is now having normal bowel movements  and is able to void.  She occasionally does have to get in the shower  and use the warm water on the abdomen technique.  However, she has no  complaints.  She says the soreness is resolving, and she is doing well.  I will see her back in 4 weeks.      Allie Bossier, MD     MCD/MEDQ  D:  10/25/2009  T:  10/26/2009  Job:  [id number]

## 2011-03-12 NOTE — Assessment & Plan Note (Signed)
[name], [name]           ACCOUNT NO.:  [id number]   MEDICAL RECORD NO.:  [id number]          PATIENT TYPE:  POB   LOCATION:  [facility] [place]         FACILITY:  [facility]   PHYSICIAN:  Maylon Cos, CNM    DATE OF BIRTH:  03-Jun-1986   DATE OF SERVICE:  04/02/2010                                  CLINIC NOTE   REASON FOR TODAY'S VISIT:  Annual exam and Pap smear.   HISTORY OF PRESENT ILLNESS:  The patient is a 25 year old gravida 1,  para 1-0-0-1 who had a normal spontaneous vaginal delivery of 8.5 pound  infant in December 2010, by our practice who presents today with no  complaints.  She is desiring a physical and Pap smear.  She is currently  using condoms for contraception and does not desire to change to a  hormonal birth control.  First day of her last menstrual period was Mar 11, 2010.   MEDICATIONS LIST:  She has no known drug allergies and is currently  continuing to take her concept DHA prenatal vitamins.   PAST MEDICAL HISTORY:  GYN history is positive for an abnormal Pap smear  in 2008, when she had a colpo and cryosurgery with CIN-2.  She has no  history of sexually transmitted infections.   PERSONAL MEDICAL HISTORY:  Negative.   FAMILY HISTORY:  Positive for chronic hypertension, COPD, diabetes, and  thyroid disease.   SURGICAL HISTORY:  Negative.   SOCIAL HISTORY:  She lives with her husband and her new baby and works  as a Museum/gallery exhibitions officer at [employer].  She is a nonsmoker.  She has not used any  illicit drugs and she denies history or current complaints of physical  or sexual abuse.   PHYSICAL EXAMINATION:  GENERAL:  Loyalty is a pleasant 25 year old  Caucasian female who appears to be in no apparent distress.  VITAL SIGNS:  Her vital signs are within normal limits.  Her pulse is  84.  Her blood pressure is 149/83.  Her weight today is 188.  Her height  is 64 inches.  HEENT:  Grossly normal.  NECK:  No lymphadenopathy.  Thyroid is soft and mobile  with no nodules  or lesions.  HEART:  Regular rate and rhythm without murmurs or bruits.  LUNGS:  Clear to auscultation bilaterally AMP.  BACK:  She has no CVA tenderness.  BREASTS:  Symmetrical bilateral.  There are no masses.  She does have  irregular striae.  They are nontender.  There is no dimpling or  retraction of the skin.  Nipples are erect without discharge.  ABDOMEN:  Irregular striae.  No masses, nontender.  LYMPH:  No lymphadenopathy.  No inguinal hernias.  Her femoral pulses  are equal and bounding bilaterally.  SKIN:  Intact, warm to touch with even hair distribution.  GENITALIA:  She is a Tanner V.  External genitalia are without lesions.  Mucous membranes are pink with irregular rugae and good tone.  Cervix is  nonfriable.  On exam, there is a scant amount of creamy discharge that  is nonodorous.  Bimanual exam reveals nonenlarged uterus that is  nontender.  Adnexa are not enlarged and nontender.  EXTREMITIES:  Warm to touch without edema.  Pedal pulses are equal.  She  has no calf pain and reflexes are equal and within normal limits on both  sides.   ASSESSMENT:  1. Well woman.  2. Health maintenance.   PLAN:  1. Pap smear were obtained and sent.  The patient is encouraged to      continue her diet and exercise regimen and to follow up annually      for physicals.  2. The patient should continue her daily multivitamins or prenatal      vitamins.   FOLLOWUP:  The patient should follow up annually or p.r.n. problems.           ______________________________  Maylon Cos, CNM     SS/MEDQ  D:  05/04/2010  T:  05/04/2010  Job:  [id number]

## 2011-03-12 NOTE — Assessment & Plan Note (Signed)
NAMEJAX, [name]           ACCOUNT NO.:  [id number]   MEDICAL RECORD NO.:  [id number]          PATIENT TYPE:  POB   LOCATION:  [facility] [place]         FACILITY:  [facility]   PHYSICIAN:  Elsie Lincoln, MD      DATE OF BIRTH:  14-Aug-1986   DATE OF SERVICE:                                  CLINIC NOTE   The patient is a 25 year old female who presents for a postpartum exam.  The patient had previously been seen here for atonic bladder after her  NSVD.  She wore Foley for a week and then was able to void easily.  She  also had questionably infected laceration that is now resolved after  taking Augmentin.  She feels remarkably better.  She is breastfeeding  well, has no postpartum depression.   PHYSICAL EXAMINATION:  BREASTS:  Soft, nonengorge, no evidence of  mastitis.  ABDOMEN:  Soft, nontender.  No organomegaly.  No hernia.  VITAL SIGNS:  Stable.  Pulse 90, blood pressure 127/88, weight 196,  height 64 inches.  PELVIC:  Genitalia, Tanner V.  Vagina, pink, normal rugae.  Cervix  closed, nontender.  Uterus anteverted, nontender.  EXTREMITIES:  No  edema.   ASSESSMENT AND PLAN:  A 25 year old female, postpartum after a NSVD with  female infant, doing well, breastfeeding with no signs of postpartum  depression.  Atonic bladder resolved.  The patient is on Micronor and  again advised to take the pill the same time every day.  When she is  done breastfeeding, she can go back on Ortho Tri-Cyclen.  The patient  may use condoms as backup when she starts ovulating.  The patient to  come in May for her annual exam.           ______________________________  Elsie Lincoln, MD     KL/MEDQ  D:  11/15/2009  T:  11/16/2009  Job:  [id number]

## 2011-03-13 ENCOUNTER — Encounter: Payer: Self-pay | Admitting: *Deleted

## 2011-04-09 ENCOUNTER — Ambulatory Visit (HOSPITAL_COMMUNITY)
Admission: RE | Admit: 2011-04-09 | Discharge: 2011-04-09 | Disposition: A | Discharge status: Home / Self Care | Payer: 59 | Source: Ambulatory Visit | Attending: Obstetrics & Gynecology | Admitting: Obstetrics & Gynecology

## 2011-04-10 ENCOUNTER — Ambulatory Visit (INDEPENDENT_AMBULATORY_CARE_PROVIDER_SITE_OTHER): Payer: 59 | Admitting: Obstetrics & Gynecology

## 2011-04-12 ENCOUNTER — Ambulatory Visit: Payer: 59

## 2011-04-18 ENCOUNTER — Encounter: Payer: Self-pay | Admitting: *Deleted

## 2011-04-19 ENCOUNTER — Ambulatory Visit (INDEPENDENT_AMBULATORY_CARE_PROVIDER_SITE_OTHER): Payer: 59

## 2011-04-19 ENCOUNTER — Other Ambulatory Visit: Payer: Self-pay | Admitting: Obstetrics and Gynecology

## 2011-04-19 DIAGNOSIS — Z1272 Encounter for screening for malignant neoplasm of vagina: Secondary | ICD-10-CM

## 2011-04-19 DIAGNOSIS — Z113 Encounter for screening for infections with a predominantly sexual mode of transmission: Secondary | ICD-10-CM

## 2011-04-22 NOTE — Assessment & Plan Note (Unsigned)
NAMECLIO, [name]           ACCOUNT NO.:  [id number]  MEDICAL RECORD NO.:  [id number]           PATIENT TYPE:  LOCATION:  [facility]           FACILITY:  PHYSICIAN:  Caren Griffins, CNM       DATE OF BIRTH:  08/22/1986  DATE OF SERVICE:  04/19/2011                                 CLINIC NOTE  HISTORY:  [name] is a G2, P 2-0-0-2 who had an uncomplicated NSVD 7 weeks ago of a female weighing 7 pounds 7 ounces.  She is thriving and now weighs 10 pounds.  She is breast feeding without supplementation and works 5 hours a day and pumping at work.  Her pregravid weight was 190. She is doing some walking for exercising.  She is getting adequate sleep and denies any postpartum depression.  She has the Ortho Evra patch and has just gotten her prescription filled for that.  Last Pap smear was normal 1 year ago.  She has a history of abnormal Pap with cryo but believes she has had two 2-3 normal Paps since then, it is not certain. She is considering having another baby in a year or so.  So, if she is not liking the Pap, she will be changing to the NuvaRing.  OBJECTIVE:  VITAL SIGNS:  BP 122/88, weight 207, height 5 feet 4 inches, and pulse 81. GENERAL:  WN/WD and NAD. BREASTS:  Lactational. ABDOMEN:  Mild diastasis, well involuted.  No tenderness or masses. PELVIC:  NEFG.  Fairly good tone and support.  Vagina rugated.  Cervix multiparous.  Scant amount of brownish discharge.  Of note, she is sexually active and has had no red bleeding for some time but continues to have scant brown spotting.  Pap smear was done.  Bimanual:  Uterus ULNS, nontender, mobile.  No adnexal tenderness or masses. EXTREMITIES:  Without edema.  ASSESSMENT:  Normal postpartum exam.  Followup will be pending result of the Pap.          ______________________________ Caren Griffins, CNM    DP/MEDQ  D:  04/19/2011  T:  04/20/2011  Job:  [id number]

## 2011-05-09 NOTE — Assessment & Plan Note (Signed)
[name], [name]           ACCOUNT NO.:  [id number]  MEDICAL RECORD NO.:  [id number]           PATIENT TYPE:  O  LOCATION:  [facility] [place]          FACILITY:  [facility]  PHYSICIAN:  Allie Bossier, MD        DATE OF BIRTH:  1985-11-16  DATE OF SERVICE:  04/10/2011                                 CLINIC NOTE  HISTORY OF PRESENT ILLNESS:  Ms. Varone is a 25 year old G2, now P2 who delivered her daughter vaginally without any lacerations or episiotomy. She has no particular GYN complaints.  She is breast-feeding.  She says that in general she has to pump most of the time because her daughter has a very recessed chin and has some difficulty latching on.  She has had intercourse but says that she used condoms.  With regard to birth control, she would like to return to a combination birth control because it helped her acne in the past and she says that she does not think she can remember to take many pills on a daily basis at the same time.  She is interested in the Ortho Evra.  I have counseled her that some women will forget it after 3 weeks but she ensured me that she will set her alarm on her phone.  Her bowel and bladder function are normal.  When I ask her about postpartum depression, she did say that for the first few weeks after the baby's birth she felt somewhat overwhelmed with two children, but now that those feelings have resolved.  PHYSICAL EXAMINATION:  Within normal limits.  ASSESSMENT AND PLAN:  Postpartum doing well.  I have given her prescription for Ortho Evra.  I have told her she can change the patch every 4 weeks continuously or every 3-4 weeks and having a period with a week's break.  She will use and let me know at her next visit which will be her annual in 3-4 weeks.  We will check her blood pressure then.     Allie Bossier, MD    MCD/MEDQ  D:  04/10/2011  T:  04/11/2011  Job:  [id number]

## 2011-07-03 ENCOUNTER — Telehealth: Payer: Self-pay

## 2011-07-04 MED ORDER — NORGESTIM-ETH ESTRAD TRIPHASIC 0.18/0.215/0.25 MG-35 MCG PO TABS: 1.0000 | ORAL_TABLET | Freq: Every day | ORAL | Status: DC

## 2011-07-04 NOTE — Telephone Encounter (Signed)
Birth control changed back to Tri-sprintec per pt request.  Sent to [facility] outpatient pharmacy.  Pt requests 90 day supply

## 2011-07-27 ENCOUNTER — Encounter: Payer: Self-pay | Admitting: Family Medicine

## 2011-07-27 ENCOUNTER — Inpatient Hospital Stay (INDEPENDENT_AMBULATORY_CARE_PROVIDER_SITE_OTHER)
Admission: RE | Admit: 2011-07-27 | Discharge: 2011-07-27 | Disposition: A | Discharge status: Home / Self Care | Payer: 59 | Source: Ambulatory Visit | Attending: Family Medicine | Admitting: Family Medicine

## 2011-07-27 DIAGNOSIS — R110 Nausea: Secondary | ICD-10-CM

## 2011-07-27 DIAGNOSIS — E785 Hyperlipidemia, unspecified: Secondary | ICD-10-CM | POA: Insufficient documentation

## 2011-07-27 DIAGNOSIS — J029 Acute pharyngitis, unspecified: Secondary | ICD-10-CM

## 2011-07-30 ENCOUNTER — Telehealth (INDEPENDENT_AMBULATORY_CARE_PROVIDER_SITE_OTHER): Payer: Self-pay | Admitting: *Deleted

## 2011-08-13 ENCOUNTER — Encounter (HOSPITAL_COMMUNITY): Payer: Self-pay | Admitting: *Deleted

## 2011-09-30 NOTE — Progress Notes (Signed)
Summary: SORE THROAT/NAUSEA (room 2)   Vital Signs:  Patient Profile:   25 Years Old Female CC:      sore throat x 24 hrs; nausea x 2-3 months Height:     65 inches Weight:      206 pounds O2 Sat:      100 % O2 treatment:    Room Air Temp:     98.6 degrees F oral Pulse rate:   89 / minute Resp:     16 per minute BP sitting:   135 / 88  (left arm) Cuff size:   large  Vitals Entered By: Lavell Islam RN (July 27, 2011 10:43 AM)                  Updated Prior Medication List: * MULTI VITAMINS AND FISH OIL qd * TRI-NISSA (OCP) daily (x 1 month) * MIRALAX daily  Current Allergies: No known allergies History of Present Illness Chief Complaint: sore throat x 24 hrs; nausea x 2-3 months History of Present Illness:  Subjective:  Patient presents with two problems: 1)  She complains of a sore throat for two days, now worse.  She has had chills but no fever, and mild fatigue.  No myalgias.  No nasal congestion or cough.   2)  She complains of recurring intermittent nausea for about 3 months.  She started on OCP's one month ago.  She has checked 3 urine pregnancy tests, all negative.  No vomiting.  Her nausea is often associated with abdominal cramps but not pain.  No distinct relationship to food.  Symptoms do not awaken her.  Does not feel like indigestion.  No change in bowel movements (chronic constipation but no change).  Zofran helps but makes her constipation worse.  No GU symptoms.  REVIEW OF SYSTEMS Constitutional Symptoms       Complains of fever, chills, and night sweats.  Ear/Nose/Throat/Mouth       Complains of sore throat.      Gastrointestinal       Complains of nausea/vomiting and constipation.   Other Comments: sore throat x 24 hours (hx Strep annually); nausea x 2-3 months   Past History:  Past Medical History: Pregnancy induced hypertension Hyperlipidemia  Past Surgical History: Reviewed history from 10/19/2008 and no changes required. Denies  surgical history  Social History: Never Smoked Alcohol use-no Drug use-no Post-partum sinced 02-26-11   Objective:  Appearance:  Patient appears healthy, stated age, and in no acute distress  Eyes:  Pupils are equal, round, and reactive to light and accomodation.  Extraocular movement is intact.  Conjunctivae are not inflamed.  Ears:  Canals normal.  Tympanic membranes normal.   Nose:  Mildly congested turbinates.  No sinus tenderness  Pharynx:  Normal  Neck:  Supple.   Tender shotty posterior nodes are palpated bilaterally.  Lungs:  Clear to auscultation.  Breath sounds are equal.  Heart:  Regular rate and rhythm without murmurs, rubs, or gallops.  Abdomen:  Nontender without masses or hepatosplenomegaly.  Bowel sounds are present.  No CVA or flank tenderness.  Skin:  No rash Rapid strep test negative  Assessment New Problems: NAUSEA ALONE (ICD-787.02) ACUTE PHARYNGITIS (ICD-462) HYPERLIPIDEMIA (ICD-272.4)  SUSPECT EARLY VIRAL URI ? GERD AS CAUSE OF NAUSEA  Plan New Orders: Rapid Strep [11914] T-Culture, Throat [[id number]] Services provided After hours-Weekends-Holidays [99051] Est. Patient Level IV [86578] Planning Comments:   Throat culture pending.  Treat symptomatically for now. Begin trial of Prilosec OTC QAM, 30  min AC.  Follow-up with gastroenterologist if nausea does not resolve.   The patient and/or caregiver has been counseled thoroughly with regard to medications prescribed including dosage, schedule, interactions, rationale for use, and possible side effects and they verbalize understanding.  Diagnoses and expected course of recovery discussed and will return if not improved as expected or if the condition worsens. Patient and/or caregiver verbalized understanding.   Orders Added: 1)  Rapid Strep [87880] 2)  T-Culture, Throat [[id number]] 3)  Services provided After hours-Weekends-Holidays [99051] 4)  Est. Patient Level IV [13086]    Laboratory  Results  Date/Time Received: July 27, 2011 10:56 AM  Date/Time Reported: July 27, 2011 10:56 AM   Other Tests  Rapid Strep: negative  Kit Test Internal QC: Negative   (Normal Range: Negative)

## 2011-09-30 NOTE — Telephone Encounter (Signed)
  Phone Note Outgoing Call   Call placed by: Clemens Catholic LPN,  July 30, 2011 1:15 PM Summary of Call: [facility] lab to obtain throat culture results. Loney Loh states that there was no throat culture sent to lab. Called pt she states that she is beginning to feel better. advised her to call back if she has any questions or concerns. Initial call taken by: Clemens Catholic LPN,  July 30, 2011 1:16 PM

## 2012-01-01 ENCOUNTER — Other Ambulatory Visit: Payer: Self-pay | Admitting: *Deleted

## 2012-01-01 NOTE — Telephone Encounter (Signed)
Pt called requesting that her RX for TriSprintec be sent to [facility] in [place] because the [facility] outpt pharmacy would substitute her OCP for Trinessa and she didn't like it.

## 2012-03-18 IMAGING — US US OB FOLLOW-UP
1 series · 13 of 27 positions shown · non-contrast
Comparison: none

[Series 1: us ob follow up · 13 of 27 slices shown]
[im 2/27]
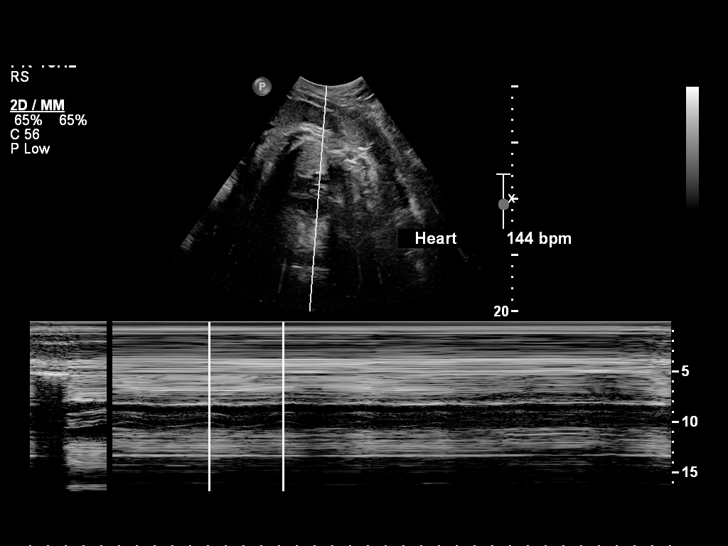
[im 4/27]
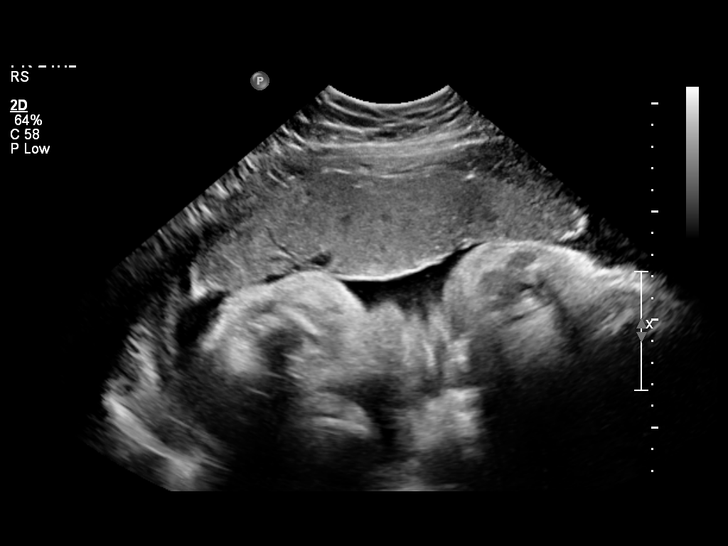
[im 6/27]
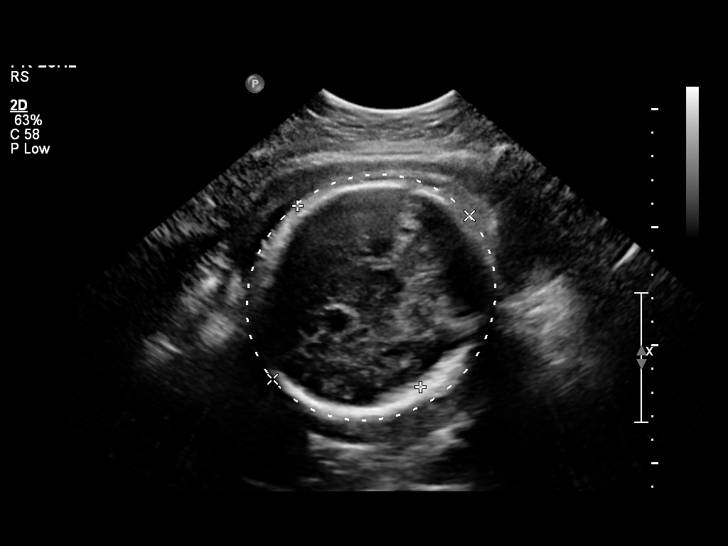
[im 8/27]
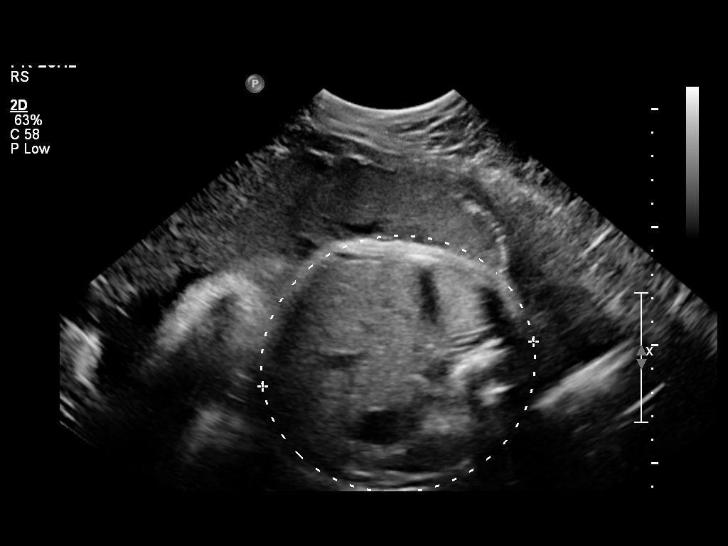
[im 10/27]
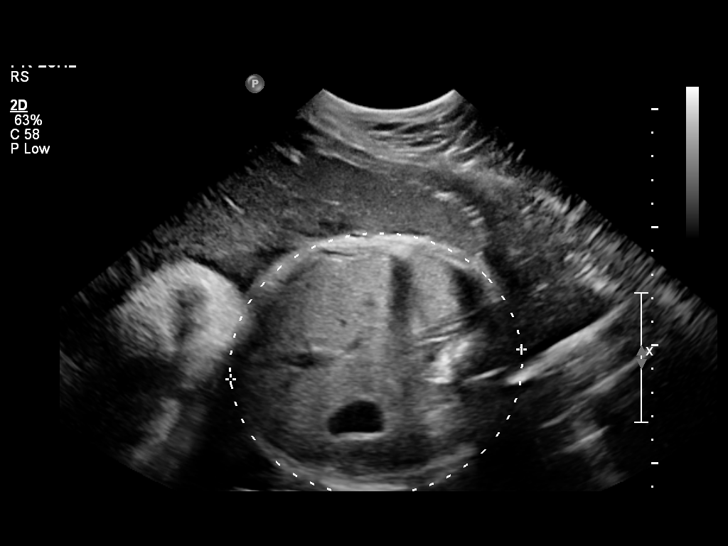
[im 12/27]
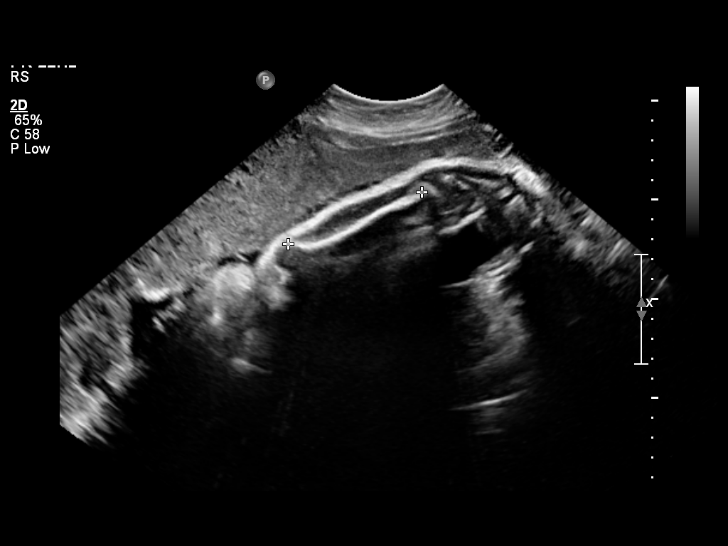
[im 14/27]
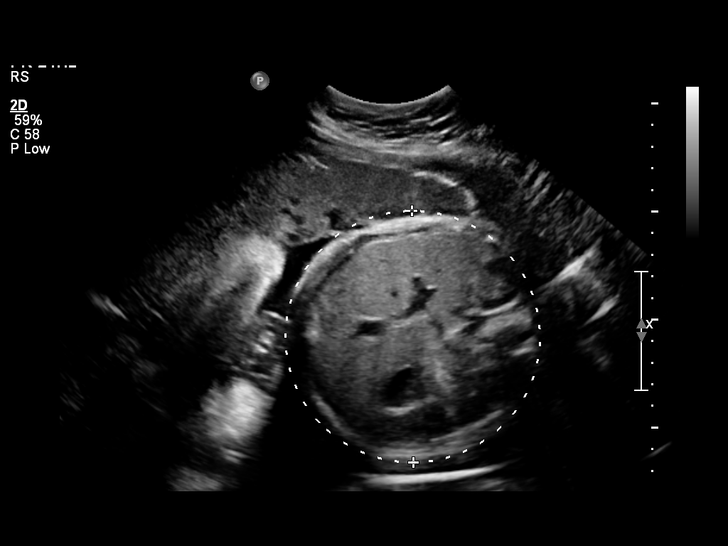
[im 16/27]
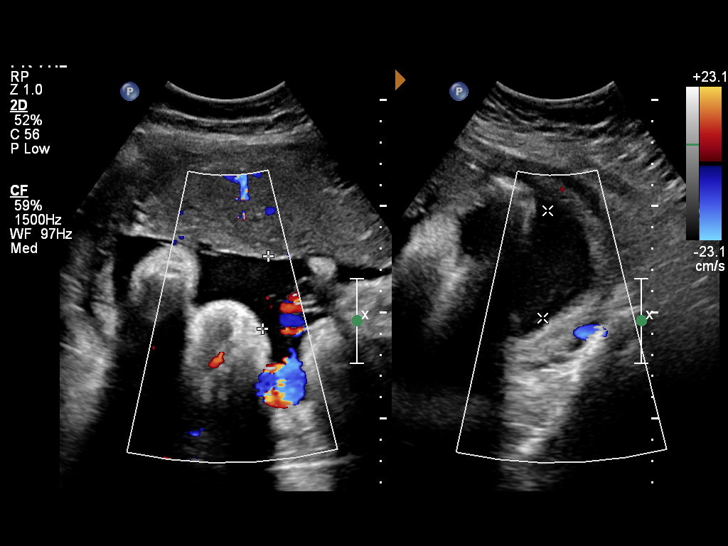
[im 18/27]
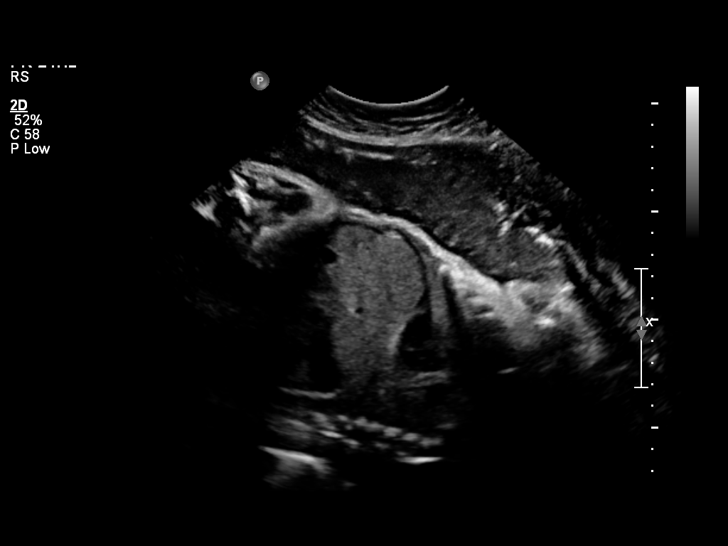
[im 20/27]
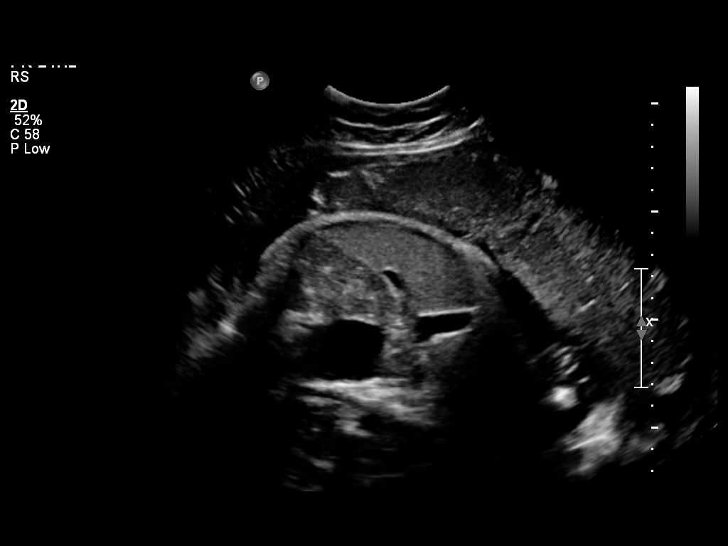
[im 22/27]
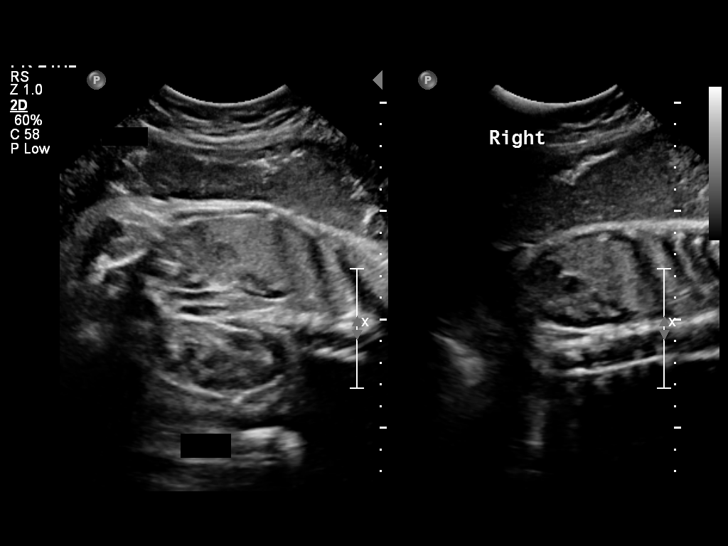
[im 24/27]
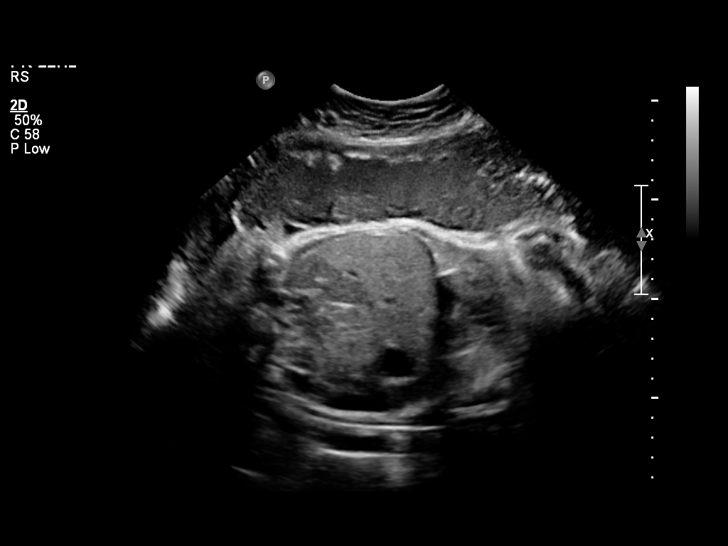
[im 26/27]
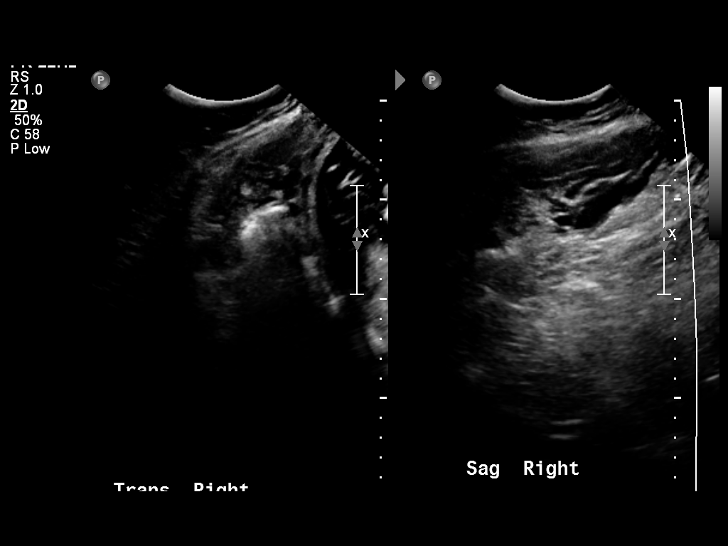

[13 of 27 positions shown; findings below may reference images not displayed]

OBSTETRICS REPORT
                      (Signed Final 02/18/2011 [DATE])

 Order#:         [id number]
Procedures

 US OB FOLLOW UP                                       76816.1
Indications

 Assess Fetal Growth / Estimated Fetal Weight
 Hypertension - Gestational
Fetal Evaluation

 Fetal Heart Rate:  144                          bpm
 Cardiac Activity:  Observed
 Presentation:      Cephalic
 Placenta:          Anterior, above cervical os
 P. Cord            Previously Visualized
 Insertion:
 AFI Sum:     17.16   cm       67  %Tile     Larg Pckt:    5.96  cm
 RUQ:   3.41    cm   RLQ:    2.75   cm    LUQ:   5.96    cm   LLQ:    5.04   cm
Biometry

 BPD:     93.2  mm     G. Age:  37w 6d                CI:        77.18   70 - 86
                                                      FL/HC:      22.4   20.9 -

 HC:     335.9  mm     G. Age:  38w 3d       42  %    HC/AC:      0.93   0.92 -

 AC:     361.5  mm     G. Age:  40w 0d     > 97  %    FL/BPD:     80.8   71 - 87
 FL:      75.3  mm     G. Age:  38w 4d       65  %    FL/AC:      20.8   20 - 24

 Est. FW:    8285  gm      8 lb 4 oz   > 90  %
Gestational Age

 LMP:           38w 0d        Date:  05/28/10                 EDD:   03/04/11
 U/S Today:     38w 5d                                        EDD:   02/27/11
 Best:          38w 0d     Det. By:  LMP  (05/28/10)          EDD:   03/04/11
Anatomy

 Cranium:           Previously seen     Aortic Arch:       Previously seen
 Fetal Cavum:       Previously seen     Ductal Arch:       Previously seen
 Ventricles:        Previously seen     Diaphragm:         Appears normal
 Choroid Plexus:    Appears normal      Stomach:           Appears normal
 Cerebellum:        Previously seen     Abdomen:           Previously seen
 Posterior Fossa:   Previously seen     Abdominal Wall:    Previously seen
 Nuchal Fold:       Previously seen     Cord Vessels:      Previously seen
 Face:              Previously seen     Kidneys:           Appear normal
 Heart:             Not well            Bladder:           Appears normal
                    visualized
                    today. Seen
                    previously.
 RVOT:              Previously seen     Spine:             Previously seen
 LVOT:              Previously seen     Limbs:             Previously seen

 Other:     Heels and 5th digit visualized previously.
Cervix Uterus Adnexa

 Cervix:       Not visualized (advanced GA >34 wks)

 Adnexa:     No abnormality visualized.
Impression

 Single live IUP in cephalic presentation.  Concordant
 measurements/assigned GA by LMP.
 EFW >90%ile, however.
 No late-developing anomaly in visualized structures above.

## 2012-07-20 ENCOUNTER — Other Ambulatory Visit: Payer: Self-pay | Admitting: *Deleted

## 2012-07-20 DIAGNOSIS — Z3041 Encounter for surveillance of contraceptive pills: Secondary | ICD-10-CM

## 2012-07-20 MED ORDER — NORGESTIM-ETH ESTRAD TRIPHASIC 0.18/0.215/0.25 MG-35 MCG PO TABS: 1.0000 | ORAL_TABLET | Freq: Every day | ORAL | Status: DC

## 2012-07-20 NOTE — Telephone Encounter (Signed)
Pt called for RF on Tri-Sprintec.  She is overdue for annual and has made an appt for 07/27/12 here.  One pkg of OCP sent to pharmacy.

## 2012-07-21 ENCOUNTER — Other Ambulatory Visit: Payer: Self-pay | Admitting: Obstetrics & Gynecology

## 2012-07-27 ENCOUNTER — Encounter: Payer: Self-pay | Admitting: Obstetrics and Gynecology

## 2012-07-27 ENCOUNTER — Ambulatory Visit (INDEPENDENT_AMBULATORY_CARE_PROVIDER_SITE_OTHER): Payer: 59 | Admitting: Obstetrics and Gynecology

## 2012-07-27 VITALS — BP 142/96 | HR 90 | Temp 98.0°F | Resp 17 | Wt 196.0 lb

## 2012-07-27 DIAGNOSIS — Z1151 Encounter for screening for human papillomavirus (HPV): Secondary | ICD-10-CM

## 2012-07-27 DIAGNOSIS — Z3041 Encounter for surveillance of contraceptive pills: Secondary | ICD-10-CM

## 2012-07-27 DIAGNOSIS — Z01419 Encounter for gynecological examination (general) (routine) without abnormal findings: Secondary | ICD-10-CM

## 2012-07-27 DIAGNOSIS — Z124 Encounter for screening for malignant neoplasm of cervix: Secondary | ICD-10-CM

## 2012-07-27 MED ORDER — NORGESTIM-ETH ESTRAD TRIPHASIC 0.18/0.215/0.25 MG-35 MCG PO TABS: 1.0000 | ORAL_TABLET | Freq: Every day | ORAL | Status: DC

## 2012-07-27 MED ORDER — NORGESTIMATE-ETH ESTRADIOL 0.25-35 MG-MCG PO TABS: 1.0000 | ORAL_TABLET | Freq: Every day | ORAL | Status: DC

## 2012-07-27 NOTE — Patient Instructions (Signed)
Preventive Care for Adults, Female A healthy lifestyle and preventive care can promote health and wellness. Preventive health guidelines for women include the following key practices.  A routine yearly physical is a good way to check with your caregiver about your health and preventive screening. It is a chance to share any concerns and updates on your health, and to receive a thorough exam.   Visit your dentist for a routine exam and preventive care every 6 months. Brush your teeth twice a day and floss once a day. Good oral hygiene prevents tooth decay and gum disease.   The frequency of eye exams is based on your age, health, family medical history, use of contact lenses, and other factors. Follow your caregiver's recommendations for frequency of eye exams.   Eat a healthy diet. Foods like vegetables, fruits, whole grains, low-fat dairy products, and lean protein foods contain the nutrients you need without too many calories. Decrease your intake of foods high in solid fats, added sugars, and salt. Eat the right amount of calories for you.Get information about a proper diet from your caregiver, if necessary.   Regular physical exercise is one of the most important things you can do for your health. Most adults should get at least 150 minutes of moderate-intensity exercise (any activity that increases your heart rate and causes you to sweat) each week. In addition, most adults need muscle-strengthening exercises on 2 or more days a week.   Maintain a healthy weight. The body mass index (BMI) is a screening tool to identify possible weight problems. It provides an estimate of body fat based on height and weight. Your caregiver can help determine your BMI, and can help you achieve or maintain a healthy weight.For adults 20 years and older:   A BMI below 18.5 is considered underweight.   A BMI of 18.5 to 24.9 is normal.   A BMI of 25 to 29.9 is considered overweight.   A BMI of 30 and above is  considered obese.   Maintain normal blood lipids and cholesterol levels by exercising and minimizing your intake of saturated fat. Eat a balanced diet with plenty of fruit and vegetables. Blood tests for lipids and cholesterol should begin at age 20 and be repeated every 5 years. If your lipid or cholesterol levels are high, you are over 50, or you are at high risk for heart disease, you may need your cholesterol levels checked more frequently.Ongoing high lipid and cholesterol levels should be treated with medicines if diet and exercise are not effective.   If you smoke, find out from your caregiver how to quit. If you do not use tobacco, do not start.   If you are pregnant, do not drink alcohol. If you are breastfeeding, be very cautious about drinking alcohol. If you are not pregnant and choose to drink alcohol, do not exceed 1 drink per day. One drink is considered to be 12 ounces (355 mL) of beer, 5 ounces (148 mL) of wine, or 1.5 ounces (44 mL) of liquor.   Avoid use of street drugs. Do not share needles with anyone. Ask for help if you need support or instructions about stopping the use of drugs.   High blood pressure causes heart disease and increases the risk of stroke. Your blood pressure should be checked at least every 1 to 2 years. Ongoing high blood pressure should be treated with medicines if weight loss and exercise are not effective.   If you are 55 to 28   years old, ask your caregiver if you should take aspirin to prevent strokes.   Diabetes screening involves taking a blood sample to check your fasting blood sugar level. This should be done once every 3 years, after age 45, if you are within normal weight and without risk factors for diabetes. Testing should be considered at a younger age or be carried out more frequently if you are overweight and have at least 1 risk factor for diabetes.   Breast cancer screening is essential preventive care for women. You should practice "breast  self-awareness." This means understanding the normal appearance and feel of your breasts and may include breast self-examination. Any changes detected, no matter how small, should be reported to a caregiver. Women in their 20s and 30s should have a clinical breast exam (CBE) by a caregiver as part of a regular health exam every 1 to 3 years. After age 40, women should have a CBE every year. Starting at age 40, women should consider having a mammography (breast X-ray test) every year. Women who have a family history of breast cancer should talk to their caregiver about genetic screening. Women at a high risk of breast cancer should talk to their caregivers about having magnetic resonance imaging (MRI) and a mammography every year.   The Pap test is a screening test for cervical cancer. A Pap test can show cell changes on the cervix that might become cervical cancer if left untreated. A Pap test is a procedure in which cells are obtained and examined from the lower end of the uterus (cervix).   Women should have a Pap test starting at age 21.   Between ages 21 and 29, Pap tests should be repeated every 2 years.   Beginning at age 30, you should have a Pap test every 3 years as long as the past 3 Pap tests have been normal.   Some women have medical problems that increase the chance of getting cervical cancer. Talk to your caregiver about these problems. It is especially important to talk to your caregiver if a new problem develops soon after your last Pap test. In these cases, your caregiver may recommend more frequent screening and Pap tests.   The above recommendations are the same for women who have or have not gotten the vaccine for human papillomavirus (HPV).   If you had a hysterectomy for a problem that was not cancer or a condition that could lead to cancer, then you no longer need Pap tests. Even if you no longer need a Pap test, a regular exam is a good idea to make sure no other problems are  starting.   If you are between ages 65 and 70, and you have had normal Pap tests going back 10 years, you no longer need Pap tests. Even if you no longer need a Pap test, a regular exam is a good idea to make sure no other problems are starting.   If you have had past treatment for cervical cancer or a condition that could lead to cancer, you need Pap tests and screening for cancer for at least 20 years after your treatment.   If Pap tests have been discontinued, risk factors (such as a new sexual partner) need to be reassessed to determine if screening should be resumed.   The HPV test is an additional test that may be used for cervical cancer screening. The HPV test looks for the virus that can cause the cell changes on the cervix.   The cells collected during the Pap test can be tested for HPV. The HPV test could be used to screen women aged 30 years and older, and should be used in women of any age who have unclear Pap test results. After the age of 30, women should have HPV testing at the same frequency as a Pap test.   Colorectal cancer can be detected and often prevented. Most routine colorectal cancer screening begins at the age of 50 and continues through age 75. However, your caregiver may recommend screening at an earlier age if you have risk factors for colon cancer. On a yearly basis, your caregiver may provide home test kits to check for hidden blood in the stool. Use of a small camera at the end of a tube, to directly examine the colon (sigmoidoscopy or colonoscopy), can detect the earliest forms of colorectal cancer. Talk to your caregiver about this at age 50, when routine screening begins. Direct examination of the colon should be repeated every 5 to 10 years through age 75, unless early forms of pre-cancerous polyps or small growths are found.   Hepatitis C blood testing is recommended for all people born from 1945 through 1965 and any individual with known risks for hepatitis C.    Practice safe sex. Use condoms and avoid high-risk sexual practices to reduce the spread of sexually transmitted infections (STIs). STIs include gonorrhea, chlamydia, syphilis, trichomonas, herpes, HPV, and human immunodeficiency virus (HIV). Herpes, HIV, and HPV are viral illnesses that have no cure. They can result in disability, cancer, and death. Sexually active women aged 25 and younger should be checked for chlamydia. Older women with new or multiple partners should also be tested for chlamydia. Testing for other STIs is recommended if you are sexually active and at increased risk.   Osteoporosis is a disease in which the bones lose minerals and strength with aging. This can result in serious bone fractures. The risk of osteoporosis can be identified using a bone density scan. Women ages 65 and over and women at risk for fractures or osteoporosis should discuss screening with their caregivers. Ask your caregiver whether you should take a calcium supplement or vitamin D to reduce the rate of osteoporosis.   Menopause can be associated with physical symptoms and risks. Hormone replacement therapy is available to decrease symptoms and risks. You should talk to your caregiver about whether hormone replacement therapy is right for you.   Use sunscreen with sun protection factor (SPF) of 30 or more. Apply sunscreen liberally and repeatedly throughout the day. You should seek shade when your shadow is shorter than you. Protect yourself by wearing long sleeves, pants, a wide-brimmed hat, and sunglasses year round, whenever you are outdoors.   Once a month, do a whole body skin exam, using a mirror to look at the skin on your back. Notify your caregiver of new moles, moles that have irregular borders, moles that are larger than a pencil eraser, or moles that have changed in shape or color.   Stay current with required immunizations.   Influenza. You need a dose every fall (or winter). The composition of  the flu vaccine changes each year, so being vaccinated once is not enough.   Pneumococcal polysaccharide. You need 1 to 2 doses if you smoke cigarettes or if you have certain chronic medical conditions. You need 1 dose at age 65 (or older) if you have never been vaccinated.   Tetanus, diphtheria, pertussis (Tdap, Td). Get 1 dose of   Tdap vaccine if you are younger than age 65, are over 65 and have contact with an infant, are a healthcare worker, are pregnant, or simply want to be protected from whooping cough. After that, you need a Td booster dose every 10 years. Consult your caregiver if you have not had at least 3 tetanus and diphtheria-containing shots sometime in your life or have a deep or dirty wound.   HPV. You need this vaccine if you are a woman age 26 or younger. The vaccine is given in 3 doses over 6 months.   Measles, mumps, rubella (MMR). You need at least 1 dose of MMR if you were born in 1957 or later. You may also need a second dose.   Meningococcal. If you are age 19 to 21 and a first-year college student living in a residence hall, or have one of several medical conditions, you need to get vaccinated against meningococcal disease. You may also need additional booster doses.   Zoster (shingles). If you are age 60 or older, you should get this vaccine.   Varicella (chickenpox). If you have never had chickenpox or you were vaccinated but received only 1 dose, talk to your caregiver to find out if you need this vaccine.   Hepatitis A. You need this vaccine if you have a specific risk factor for hepatitis A virus infection or you simply wish to be protected from this disease. The vaccine is usually given as 2 doses, 6 to 18 months apart.   Hepatitis B. You need this vaccine if you have a specific risk factor for hepatitis B virus infection or you simply wish to be protected from this disease. The vaccine is given in 3 doses, usually over 6 months.  Preventive Services /  Frequency Ages 19 to 39  Blood pressure check.** / Every 1 to 2 years.   Lipid and cholesterol check.** / Every 5 years beginning at age 20.   Clinical breast exam.** / Every 3 years for women in their 20s and 30s.   Pap test.** / Every 2 years from ages 21 through 29. Every 3 years starting at age 30 through age 65 or 70 with a history of 3 consecutive normal Pap tests.   HPV screening.** / Every 3 years from ages 30 through ages 65 to 70 with a history of 3 consecutive normal Pap tests.   Hepatitis C blood test.** / For any individual with known risks for hepatitis C.   Skin self-exam. / Monthly.   Influenza immunization.** / Every year.   Pneumococcal polysaccharide immunization.** / 1 to 2 doses if you smoke cigarettes or if you have certain chronic medical conditions.   Tetanus, diphtheria, pertussis (Tdap, Td) immunization. / A one-time dose of Tdap vaccine. After that, you need a Td booster dose every 10 years.   HPV immunization. / 3 doses over 6 months, if you are 26 and younger.   Measles, mumps, rubella (MMR) immunization. / You need at least 1 dose of MMR if you were born in 1957 or later. You may also need a second dose.   Meningococcal immunization. / 1 dose if you are age 19 to 21 and a first-year college student living in a residence hall, or have one of several medical conditions, you need to get vaccinated against meningococcal disease. You may also need additional booster doses.   Varicella immunization.** / Consult your caregiver.   Hepatitis A immunization.** / Consult your caregiver. 2 doses, 6 to 18 months   apart.   Hepatitis B immunization.** / Consult your caregiver. 3 doses usually over 6 months.  Ages 40 to 64  Blood pressure check.** / Every 1 to 2 years.   Lipid and cholesterol check.** / Every 5 years beginning at age 20.   Clinical breast exam.** / Every year after age 40.   Mammogram.** / Every year beginning at age 40 and continuing for as  long as you are in good health. Consult with your caregiver.   Pap test.** / Every 3 years starting at age 30 through age 65 or 70 with a history of 3 consecutive normal Pap tests.   HPV screening.** / Every 3 years from ages 30 through ages 65 to 70 with a history of 3 consecutive normal Pap tests.   Fecal occult blood test (FOBT) of stool. / Every year beginning at age 50 and continuing until age 75. You may not need to do this test if you get a colonoscopy every 10 years.   Flexible sigmoidoscopy or colonoscopy.** / Every 5 years for a flexible sigmoidoscopy or every 10 years for a colonoscopy beginning at age 50 and continuing until age 75.   Hepatitis C blood test.** / For all people born from 1945 through 1965 and any individual with known risks for hepatitis C.   Skin self-exam. / Monthly.   Influenza immunization.** / Every year.   Pneumococcal polysaccharide immunization.** / 1 to 2 doses if you smoke cigarettes or if you have certain chronic medical conditions.   Tetanus, diphtheria, pertussis (Tdap, Td) immunization.** / A one-time dose of Tdap vaccine. After that, you need a Td booster dose every 10 years.   Measles, mumps, rubella (MMR) immunization. / You need at least 1 dose of MMR if you were born in 1957 or later. You may also need a second dose.   Varicella immunization.** / Consult your caregiver.   Meningococcal immunization.** / Consult your caregiver.   Hepatitis A immunization.** / Consult your caregiver. 2 doses, 6 to 18 months apart.   Hepatitis B immunization.** / Consult your caregiver. 3 doses, usually over 6 months.  Ages 65 and over  Blood pressure check.** / Every 1 to 2 years.   Lipid and cholesterol check.** / Every 5 years beginning at age 20.   Clinical breast exam.** / Every year after age 40.   Mammogram.** / Every year beginning at age 40 and continuing for as long as you are in good health. Consult with your caregiver.   Pap test.** /  Every 3 years starting at age 30 through age 65 or 70 with a 3 consecutive normal Pap tests. Testing can be stopped between 65 and 70 with 3 consecutive normal Pap tests and no abnormal Pap or HPV tests in the past 10 years.   HPV screening.** / Every 3 years from ages 30 through ages 65 or 70 with a history of 3 consecutive normal Pap tests. Testing can be stopped between 65 and 70 with 3 consecutive normal Pap tests and no abnormal Pap or HPV tests in the past 10 years.   Fecal occult blood test (FOBT) of stool. / Every year beginning at age 50 and continuing until age 75. You may not need to do this test if you get a colonoscopy every 10 years.   Flexible sigmoidoscopy or colonoscopy.** / Every 5 years for a flexible sigmoidoscopy or every 10 years for a colonoscopy beginning at age 50 and continuing until age 75.   Hepatitis   C blood test.** / For all people born from 1945 through 1965 and any individual with known risks for hepatitis C.   Osteoporosis screening.** / A one-time screening for women ages 65 and over and women at risk for fractures or osteoporosis.   Skin self-exam. / Monthly.   Influenza immunization.** / Every year.   Pneumococcal polysaccharide immunization.** / 1 dose at age 65 (or older) if you have never been vaccinated.   Tetanus, diphtheria, pertussis (Tdap, Td) immunization. / A one-time dose of Tdap vaccine if you are over 65 and have contact with an infant, are a healthcare worker, or simply want to be protected from whooping cough. After that, you need a Td booster dose every 10 years.   Varicella immunization.** / Consult your caregiver.   Meningococcal immunization.** / Consult your caregiver.   Hepatitis A immunization.** / Consult your caregiver. 2 doses, 6 to 18 months apart.   Hepatitis B immunization.** / Check with your caregiver. 3 doses, usually over 6 months.  ** Family history and personal history of risk and conditions may change your caregiver's  recommendations. Document Released: 12/10/2001 Document Revised: 10/03/2011 Document Reviewed: 03/11/2011 [employer] Patient Information 2012 [employer]. 

## 2012-07-27 NOTE — Addendum Note (Signed)
Addended by: Granville Lewis on: 07/27/2012 11:35 AM   Modules accepted: Orders

## 2012-07-27 NOTE — Progress Notes (Signed)
CC: Gynecologic Exam    HPI: [name] ia a 26 y.o. G1P1001 presents for Pap and GYN exam. 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  LMP 07/15/12. Denies abnormal bleeding, fibroids, STDs. Has had ovarian cysts in the past before being on Sprintec. Denies family history of breast, ovarian, uterine or colon cancer except great aunts with breast Ca.  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   ROS: 12 pt ROS negative  Past Medical History  Diagnosis Date  . Abnormal Pap smear 2008, cryo and colpo CIN-2    OB History    Grav Para Term Preterm Abortions TAB SAB Ect Mult Living   1 1 1             # Outc Date GA Lbr Len/2nd Wgt Sex Del Anes PTL Lv   1 TRM 5/12 [redacted]w[redacted]d 00:00  F        [facility] care x 2 pregnancies  Past Surgical History  Procedure Date  . Negative     History   Social History  . Marital Status: Married    Spouse Name: Matalyn Hook    Number of Children: 2  . 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 Smoking status: Never Smoker   . Smokeless tobacco: Not on file  . Alcohol Use: No  . Drug Use: No  . Sexually Active: Yes -- Female partner(s)    Birth Control/ Protection: Patch   Other Topics Concern  . Not on file   Social History Narrative  . No narrative on file    Current Outpatient Prescriptions on File Prior to Visit  Medication Sig Dispense Refill  . ferrous fumarate (HEMOCYTE - 106 MG FE) 325 (106 FE) MG TABS Take 1 tablet by mouth.      . Norgestimate-Ethinyl Estradiol Triphasic (TRI-SPRINTEC) 0.18/0.215/0.25 MG-35 MCG tablet Take 1 tablet by mouth daily.  1 Package  0    No Known Allergies  ROS: Pertinent items in HPI  GYN FOCUSED PHYSICAL EXAM General: Well nourished, well developed female in no acute distress Thyroid: not enlarged Cardiovascular: Normal rate and rhythm, no murmer Respiratory: Normal effort, CTA bilateral Abdomen: Soft,  nontender Back: No CVAT Extremities: No edema Neurologic: Alert and oriented  Speculum exam: NEFG, good tone and support; vagina with physiologic discharge, no blood; cervix clean Bimanual exam: cervix closed, no CMT; uterus NSSP; no adnexal tenderness or masses  ASSESSMENT 1. Routine gynecological examination   Healthy female  PLAN Pap done Sprintec refilled See AVS for patient education PMD Cordelia Pen [place] F/U with PMP for routine    Danae Orleans, CNM 07/27/2012 10:52 AM

## 2013-11-30 ENCOUNTER — Ambulatory Visit: Payer: 59 | Admitting: Obstetrics & Gynecology

## 2013-12-09 ENCOUNTER — Ambulatory Visit (INDEPENDENT_AMBULATORY_CARE_PROVIDER_SITE_OTHER): Payer: BC Managed Care – PPO | Admitting: Obstetrics & Gynecology

## 2013-12-09 ENCOUNTER — Encounter: Payer: Self-pay | Admitting: Obstetrics & Gynecology

## 2013-12-09 VITALS — BP 140/88 | HR 80 | Resp 16 | Ht 63.0 in | Wt 197.0 lb

## 2013-12-09 DIAGNOSIS — Z01419 Encounter for gynecological examination (general) (routine) without abnormal findings: Secondary | ICD-10-CM

## 2013-12-09 DIAGNOSIS — N6320 Unspecified lump in the left breast, unspecified quadrant: Secondary | ICD-10-CM

## 2013-12-09 DIAGNOSIS — Z124 Encounter for screening for malignant neoplasm of cervix: Secondary | ICD-10-CM

## 2013-12-09 DIAGNOSIS — Z1151 Encounter for screening for human papillomavirus (HPV): Secondary | ICD-10-CM

## 2013-12-09 DIAGNOSIS — N63 Unspecified lump in unspecified breast: Secondary | ICD-10-CM

## 2013-12-11 NOTE — Progress Notes (Signed)
  Subjective:     [name] is a 28 y.o. female here for a routine exam.  Current complaints: none.  Personal health questionnaire reviewed: yes.   Gynecologic History Patient's last menstrual period was 11/29/2013. Contraception: coitus interruptus and condoms Last Pap: 2013. Results were: normal Last mammogram: n/a.   Obstetric History OB History  Gravida Para Term Preterm AB SAB TAB Ectopic Multiple Living  2 2 2       2     # Outcome Date GA Lbr Len/2nd Weight Sex Delivery Anes PTL Lv  2 TRM 02/26/11 [redacted]w[redacted]d   F SVD     1 TRM      SVD          The following portions of the patient's history were reviewed and updated as appropriate: allergies, current medications, past family history, past medical history, past social history, past surgical history and problem list.  Review of Systems Pertinent items are noted in HPI.    Objective:      Filed Vitals:   12/09/13 1524  BP: 140/88  Pulse: 80  Resp: 16  Height: 5\' 3"  (1.6 m)  Weight: 197 lb (89.359 kg)   Vitals:  WNL General appearance: alert, cooperative and no distress Head: Normocephalic, without obvious abnormality, atraumatic Eyes: negative Throat: lips, mucosa, and tongue normal; teeth and gums normal Lungs: clear to auscultation bilaterally Breasts: small, mildly tender lump at 9 o'clock in left breast--mammogram and [place] ordered Heart: regular rate and rhythm Abdomen: soft, non-tender; bowel sounds normal; no masses,  no organomegaly  Pelvic:  External Genitalia:  Tanner V, no lesion Urethra Vagina:  Pink, normal rugae, no blood or discharge Cervix:  No CMT, anterior lip of cervix looks scarred from prior procedure done on cervix in 2008 Uterus:  Normal size and contour, non tender Adnexa:  Normal, no masses, non tender Rectovaginal Septum:  Non tender, no masses  Extremities: no edema, redness or tenderness in the calves or thighs Skin: no lesions or rash Lymph nodes: Axillary adenopathy: none          Assessment:    Healthy female exam.    Plan:    Education reviewed: self breast exams and skin cancer screening. Contraception: coitus interruptus and condoms. Follow up in: 1 year. Pap done with HPV Considering BTL in future Left breast US and mammogram ordered.    LEGGETT,KELLY H.

## 2013-12-15 NOTE — Addendum Note (Signed)
Addended by: Asencion Islam on: 12/15/2013 11:14 AM   Modules accepted: Orders

## 2013-12-16 ENCOUNTER — Encounter: Payer: Self-pay | Admitting: Obstetrics & Gynecology

## 2013-12-16 ENCOUNTER — Telehealth: Payer: Self-pay | Admitting: *Deleted

## 2013-12-16 DIAGNOSIS — R87610 Atypical squamous cells of undetermined significance on cytologic smear of cervix (ASC-US): Secondary | ICD-10-CM | POA: Insufficient documentation

## 2013-12-16 NOTE — Telephone Encounter (Signed)
LM on voicemail that pap was neg but due to h/o Leep will repeat pap with co-testing in 1 year.  If neg will extend paps.

## 2013-12-24 ENCOUNTER — Ambulatory Visit
Admission: RE | Admit: 2013-12-24 | Discharge: 2013-12-24 | Disposition: A | Discharge status: Home / Self Care | Payer: BC Managed Care – PPO | Source: Ambulatory Visit | Attending: Obstetrics & Gynecology | Admitting: Obstetrics & Gynecology

## 2013-12-24 DIAGNOSIS — N6320 Unspecified lump in the left breast, unspecified quadrant: Secondary | ICD-10-CM

## 2014-08-29 ENCOUNTER — Encounter: Payer: Self-pay | Admitting: Obstetrics & Gynecology

## 2014-12-22 ENCOUNTER — Ambulatory Visit (INDEPENDENT_AMBULATORY_CARE_PROVIDER_SITE_OTHER): Payer: BLUE CROSS/BLUE SHIELD | Admitting: Obstetrics & Gynecology

## 2014-12-22 ENCOUNTER — Encounter: Payer: Self-pay | Admitting: Obstetrics & Gynecology

## 2014-12-22 VITALS — BP 139/91 | HR 87 | Temp 97.8°F | Resp 18 | Ht 64.0 in | Wt 196.0 lb

## 2014-12-22 DIAGNOSIS — N939 Abnormal uterine and vaginal bleeding, unspecified: Secondary | ICD-10-CM

## 2014-12-22 DIAGNOSIS — Z01419 Encounter for gynecological examination (general) (routine) without abnormal findings: Secondary | ICD-10-CM | POA: Diagnosis not present

## 2014-12-23 LAB — TSH: TSH: 2.588 u[IU]/mL (ref 0.350–4.500)

## 2014-12-23 NOTE — Progress Notes (Signed)
  Subjective:     [name] is a 29 y.o. female here for a routine exam.  Current complaints: Bleeding mid cycle.  Pt had one episode last year but has happened several times in the past 6 months.  At first it was only spotting.  This past month it was "2 full periods in one month".  Pt denies thyroid problems, abnormal bleeding prior to above, abdominal pain, painful sex, discharge.   Gynecologic History Patient's last menstrual period was 12/09/2014. Contraception: coitus interruptus and condoms Last Pap: 2015. Results were: normal Last mammogram: 2015. Results were: normal  Obstetric History OB History  Gravida Para Term Preterm AB SAB TAB Ectopic Multiple Living  2 2 2       2     # Outcome Date GA Lbr Len/2nd Weight Sex Delivery Anes PTL Lv  2 Term 02/26/11 [redacted]w[redacted]d   F Vag-Spont     1 Term      Vag-Spont          The following portions of the patient's history were reviewed and updated as appropriate: allergies, current medications, past family history, past medical history, past social history, past surgical history and problem list.  Review of Systems Pertinent items are noted in HPI.    Objective:      Filed Vitals:   12/22/14 1512  BP: 139/91  Pulse: 87  Temp: 97.8 F (36.6 C)  TempSrc: Oral  Resp: 18  Height: 5\' 4"  (1.626 m)  Weight: 196 lb (88.905 kg)   Vitals:  WNL General appearance: alert, cooperative and no distress Head: Normocephalic, without obvious abnormality, atraumatic Eyes: negative Throat: lips, mucosa, and tongue normal; teeth and gums normal Lungs: clear to auscultation bilaterally Breasts: normal appearance, no masses or tenderness, No nipple retraction or dimpling, No nipple discharge or bleeding Heart: regular rate and rhythm Abdomen: soft, non-tender; bowel sounds normal; no masses,  no organomegaly  Pelvic:  External Genitalia:  Tanner V, no lesion Urethra:  No prolapse Vagina:  Pink, normal rugae, no blood or  discharge Cervix:  No CMT, no lesion Uterus:  Normal size and contour, non tender Adnexa:  Normal, no masses, non tender  Extremities: no edema, redness or tenderness in the calves or thighs Skin: no lesions or rash Lymph nodes: Axillary adenopathy: none         Assessment:    Healthy female exam.   Irregular menses   Plan:    Education reviewed: self breast exams. Contraception: coitus interruptus and condoms. Follow up in: 2 weeks. Check TSH  TVUS for irregular bleeding -- eval for polyp and fibroids Borderline HTN--will check at next visit.

## 2014-12-26 ENCOUNTER — Telehealth: Payer: Self-pay | Admitting: *Deleted

## 2014-12-26 NOTE — Telephone Encounter (Signed)
Pt notified of normal TSH level

## 2015-01-13 ENCOUNTER — Ambulatory Visit (INDEPENDENT_AMBULATORY_CARE_PROVIDER_SITE_OTHER): Payer: BLUE CROSS/BLUE SHIELD

## 2015-01-13 DIAGNOSIS — D251 Intramural leiomyoma of uterus: Secondary | ICD-10-CM

## 2015-01-13 DIAGNOSIS — N939 Abnormal uterine and vaginal bleeding, unspecified: Secondary | ICD-10-CM

## 2015-01-18 ENCOUNTER — Telehealth: Payer: Self-pay | Admitting: *Deleted

## 2015-01-18 NOTE — Telephone Encounter (Signed)
-----   Message from Guss Bunde, MD sent at 01/18/2015 11:10 AM EDT ----- Nml [place] (other than intramural fibroid).  Patient needs a BP recheck.  RN to call patient with results and request BP check.  Patient can make appt to discuss [place] and or treatment.

## 2015-01-18 NOTE — Telephone Encounter (Signed)
LM on voicemail of U/S report and need for an appt with Dr Gala Romney to discuss treatment of bleeding and a BP check.

## 2015-02-07 ENCOUNTER — Ambulatory Visit (INDEPENDENT_AMBULATORY_CARE_PROVIDER_SITE_OTHER): Payer: BLUE CROSS/BLUE SHIELD | Admitting: Obstetrics & Gynecology

## 2015-02-07 ENCOUNTER — Encounter: Payer: Self-pay | Admitting: Obstetrics & Gynecology

## 2015-02-07 VITALS — BP 116/74 | HR 78 | Resp 16 | Ht 64.0 in | Wt 196.0 lb

## 2015-02-07 DIAGNOSIS — N939 Abnormal uterine and vaginal bleeding, unspecified: Secondary | ICD-10-CM

## 2015-02-07 DIAGNOSIS — R896 Abnormal cytological findings in specimens from other organs, systems and tissues: Secondary | ICD-10-CM | POA: Diagnosis not present

## 2015-02-07 DIAGNOSIS — Z1151 Encounter for screening for human papillomavirus (HPV): Secondary | ICD-10-CM

## 2015-02-07 DIAGNOSIS — Z30011 Encounter for initial prescription of contraceptive pills: Secondary | ICD-10-CM

## 2015-02-07 DIAGNOSIS — Z124 Encounter for screening for malignant neoplasm of cervix: Secondary | ICD-10-CM | POA: Diagnosis not present

## 2015-02-07 DIAGNOSIS — IMO0002 Reserved for concepts with insufficient information to code with codable children: Secondary | ICD-10-CM

## 2015-02-07 MED ORDER — NORGESTIM-ETH ESTRAD TRIPHASIC 0.18/0.215/0.25 MG-35 MCG PO TABS: 1.0000 | ORAL_TABLET | Freq: Every day | ORAL | Status: AC

## 2015-02-07 NOTE — Progress Notes (Signed)
   Subjective:    Patient ID: [name], female    DOB: 23-Jun-1986, 29 y.o.   MRN: [id number]  HPI  Pt presents for f/u of [place] and abnormal uterine bleeding.  Pt has one small posterior fundal fibroid, nml 9 mm lining, nml ovaries with hemorrhagic cyst.  Pt has not had any more bleeding since last visit.   nml TSH   Pt would like to start birth control  Pt has been on OCPs in the past nad would like to try those.  This will prevent pregnancy and help with her acne as well as her irregular bleeding  Pt also presents for BP check  Filed Vitals:   02/07/15 1615  BP: 116/74  Pulse: 78  Resp: 16  Height: 5\' 4"  (1.626 m)  Weight: 196 lb (88.905 kg)     Review of Systems  Constitutional: Negative.   Respiratory: Negative.   Gastrointestinal: Negative.   Genitourinary: Negative for vaginal bleeding, vaginal discharge, vaginal pain and pelvic pain.  Psychiatric/Behavioral: Negative.    Past Medical History  Diagnosis Date  . Abnormal Pap smear 2008, cryo and colpo CIN-2       Objective:   Physical Exam  Constitutional: She appears well-developed and well-nourished. No distress.  HENT:  Head: Normocephalic and atraumatic.  Eyes: Conjunctivae are normal.  Pulmonary/Chest: Effort normal.  Abdominal: Soft. She exhibits no distension. There is no tenderness.  Genitourinary: Vagina normal. No vaginal discharge found.  Small amount of bleeding after pap   Vitals reviewed.         Assessment & Plan:  29 yo female with irregular menstrual cycles. Pt has new desire to have birth control. Pt also has stable acne BP is nml today (pt thinks is was machine last time--she has always had nml BP)  Considering all the stable and new problems as above, OCPs is a good prescription medication that would address all 3. No contraindication to OCPs in medical history Tri Sprintec ordered and instructions for use given.  Of note:  Review of chart:  Pt should have gotten pap smear  at last visit.  Will do pap with co testing today.  Last pap was Asc[facility] in 2015 and had LEEP in 2008.

## 2015-02-10 LAB — CYTOLOGY - PAP

## 2015-06-26 ENCOUNTER — Encounter: Payer: BLUE CROSS/BLUE SHIELD | Admitting: Obstetrics & Gynecology

## 2016-02-11 IMAGING — US US PELVIS COMPLETE
1 series · 14 of 25 positions shown · non-contrast
Comparison: CT 04/01/2008

CLINICAL DATA: Abnormal uterine bleeding

EXAM:
TRANSABDOMINAL AND TRANSVAGINAL ULTRASOUND OF PELVIS
TECHNIQUE: Both transabdominal and transvaginal ultrasound examinations of the
pelvis were performed. Transabdominal technique was performed for
global imaging of the pelvis including uterus, ovaries, adnexal
regions, and pelvic cul-de-sac. It was necessary to proceed with
endovaginal exam following the transabdominal exam to visualize the
uterus, endometrium, ovaries and adnexa .

[Series 1: us pelvis complete · 0.20mm/px · 14 of 108 slices shown]
[im 1/108]
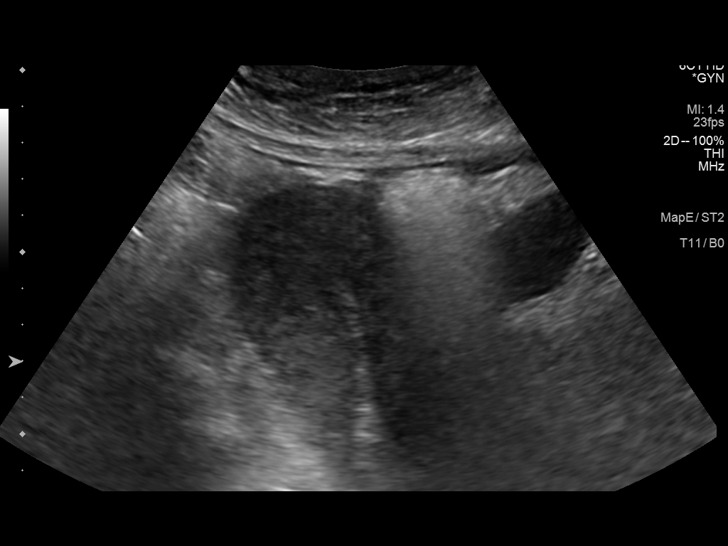
[im 9/108]
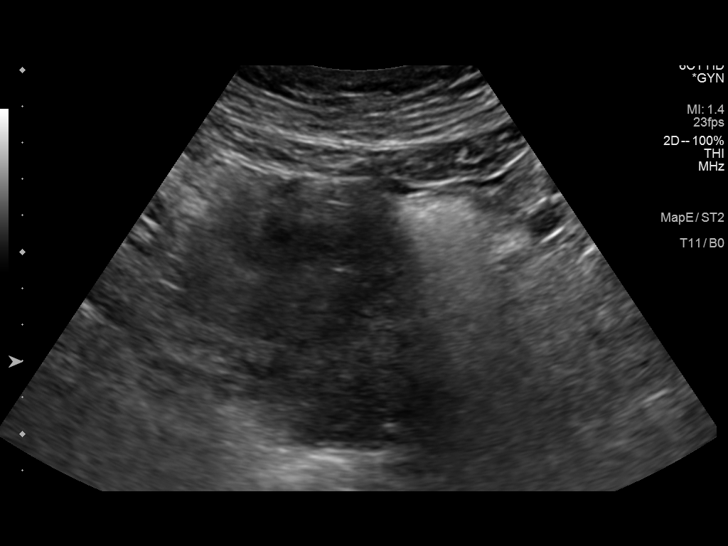
[im 18/108]
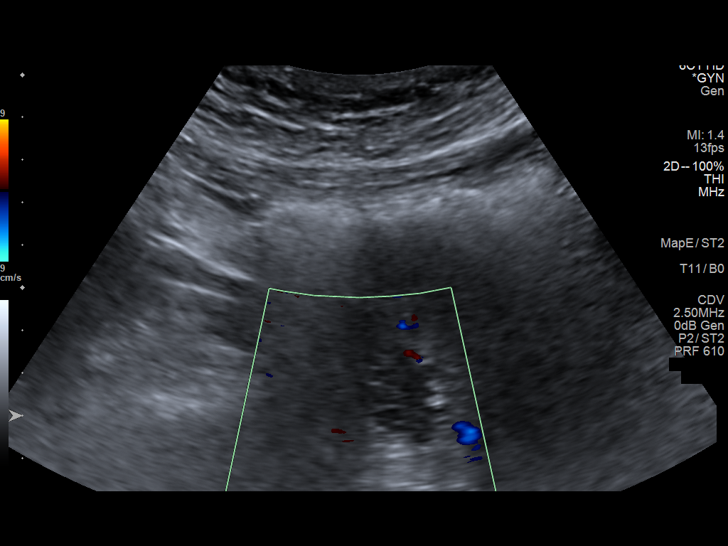
[im 27/108]
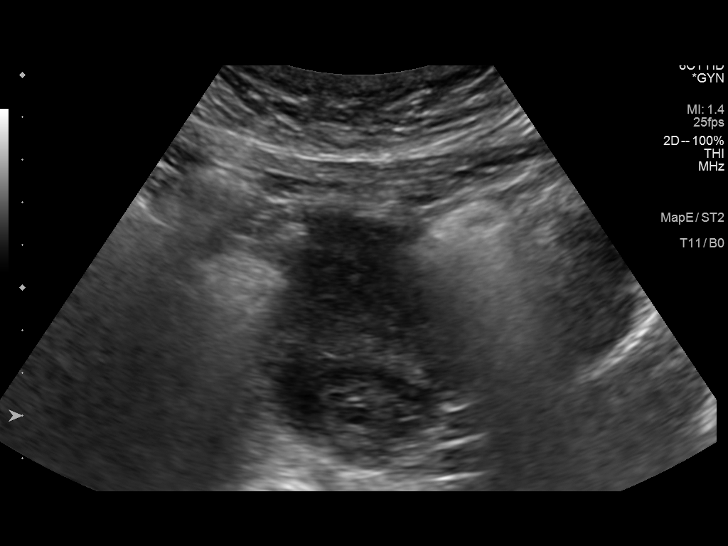
[im 36/108]
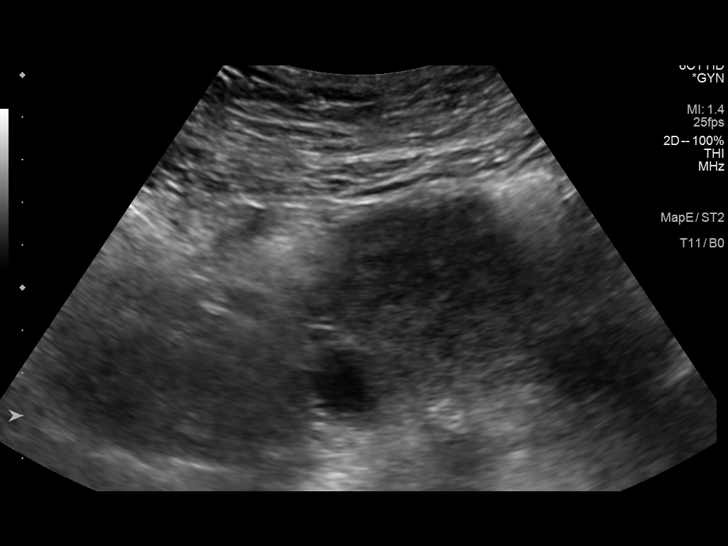
[im 41/108]
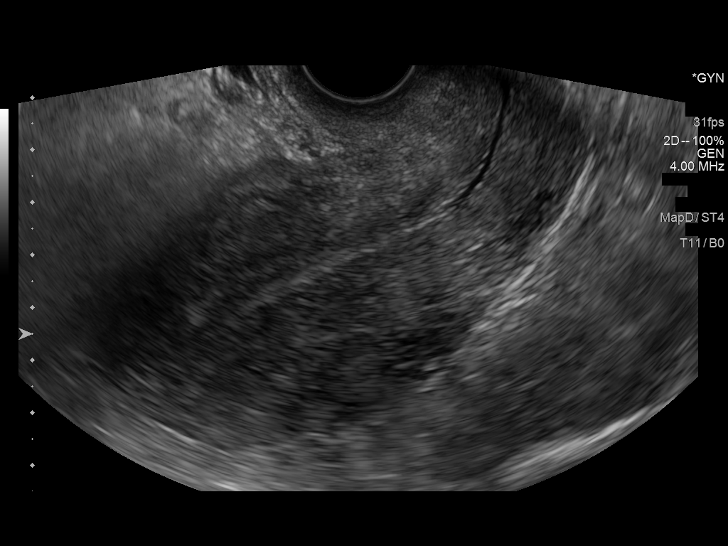
[im 50/108]
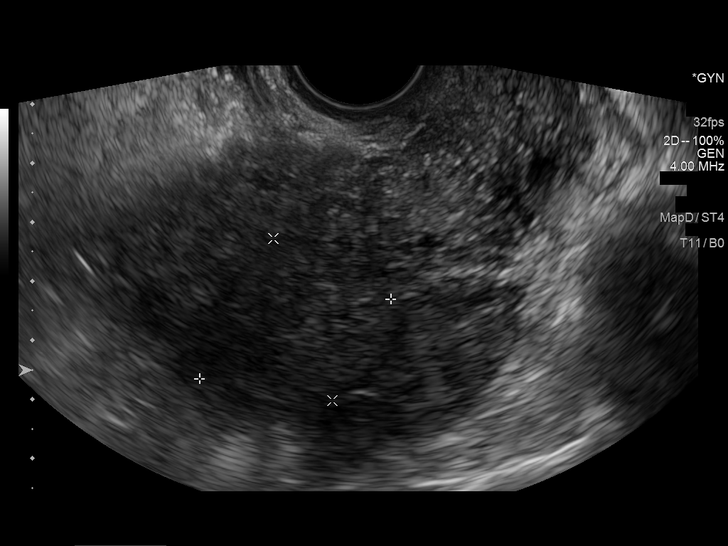
[im 58/108]
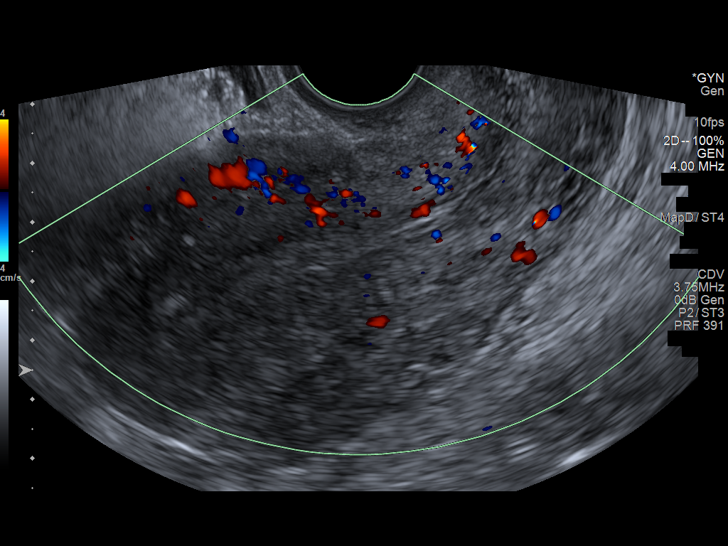
[im 67/108]
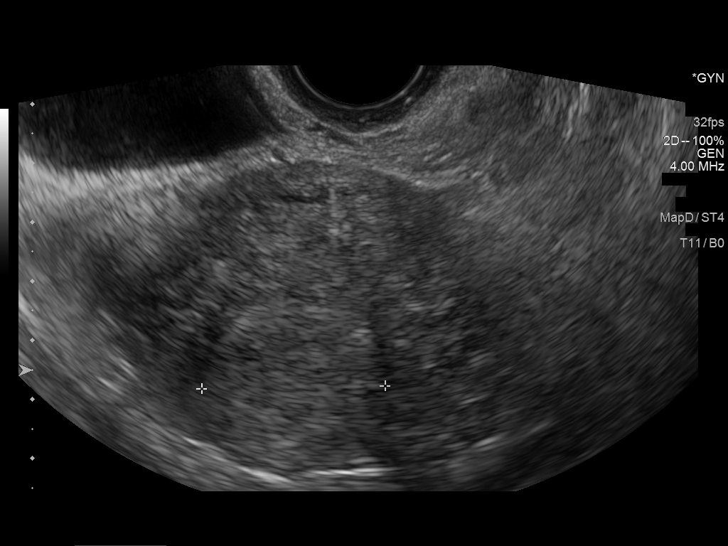
[im 72/108]
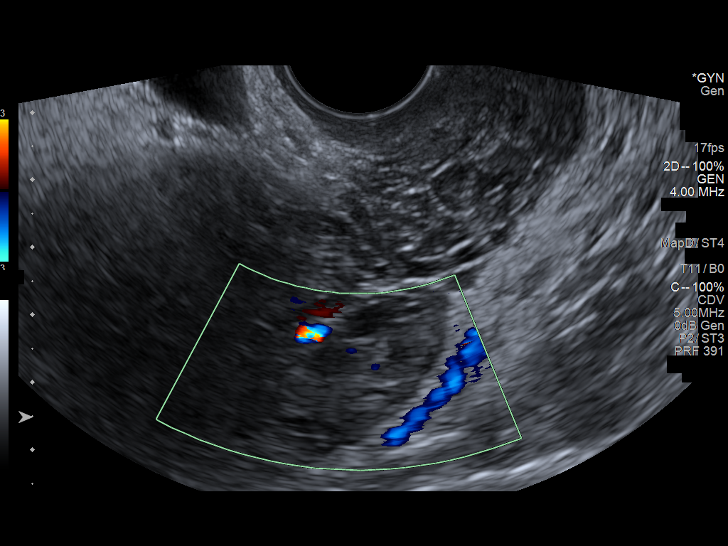
[im 81/108]
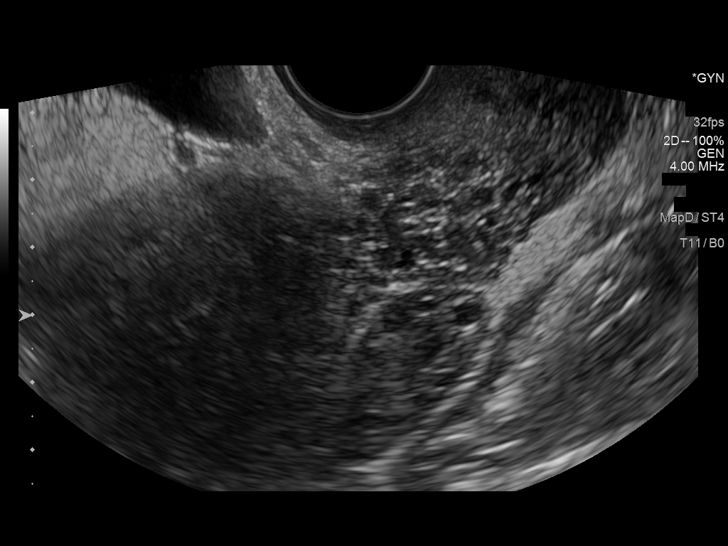
[im 90/108]
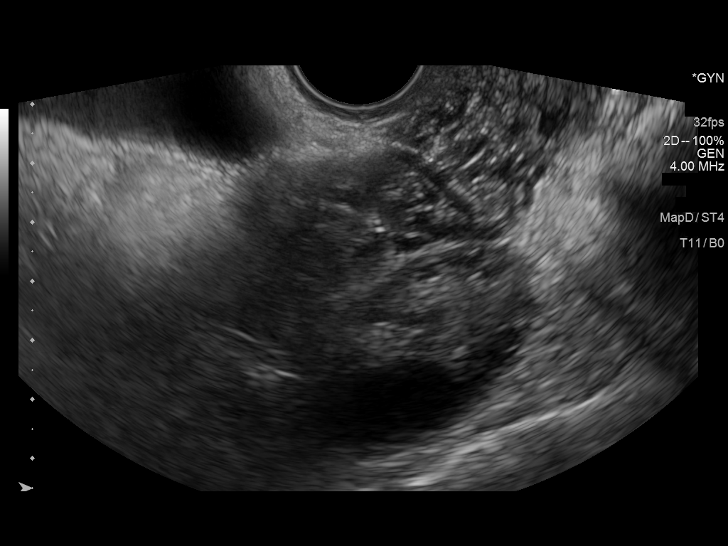
[im 99/108]
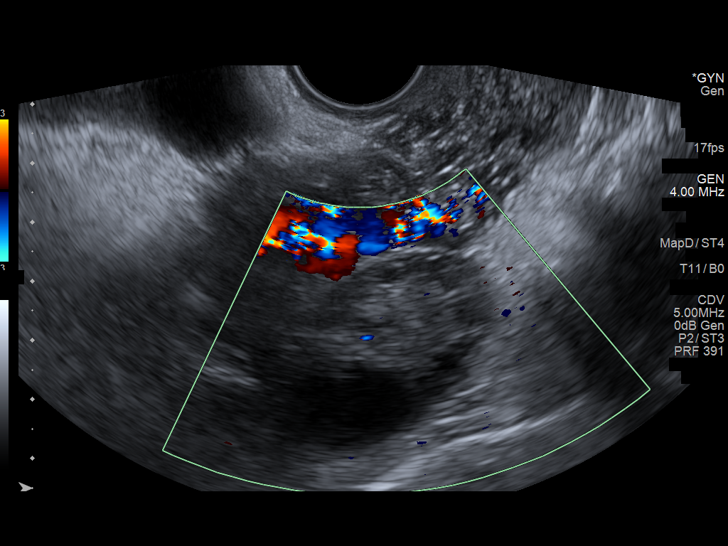
[im 108/108]
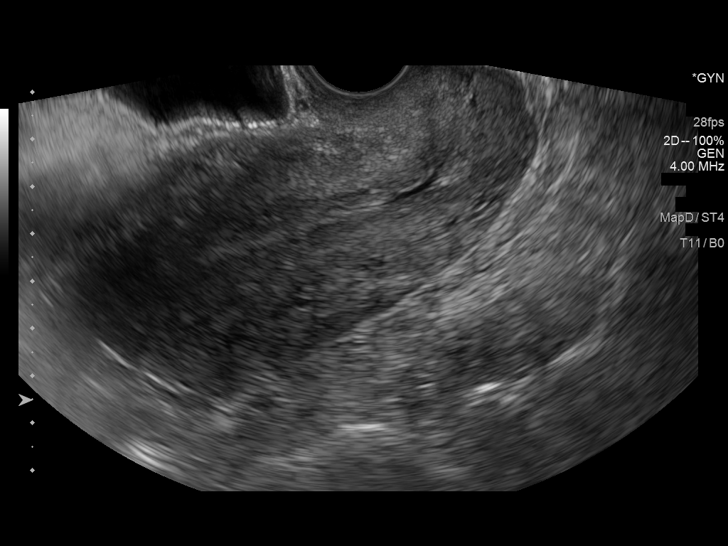

[14 of 25 positions shown; findings below may reference images not displayed]

FINDINGS: Uterus

Measurements: 10.0 x 5.4 x 5.7 cm. Posterior fundal fibroid measures
3.5 x 3.1 x 2.9 cm.

Endometrium

Thickness: 9 mm in thickness.  No focal abnormality visualized.

Right ovary

Measurements: 4.4 x 2.9 x 2.9 cm. Small hemorrhagic cyst or follicle
measures up to 2.4 cm.

Left ovary

Measurements: 3.4 x 1.6 x 2.0 cm. Normal appearance/no adnexal mass.

Other findings

No free fluid.
IMPRESSION: Posterior intramural fundal fibroid measuring up to 3.5 cm.

## 2016-11-24 ENCOUNTER — Emergency Department
Admission: EM | Admit: 2016-11-24 | Discharge: 2016-11-24 | Disposition: A | Discharge status: Home / Self Care | Payer: BLUE CROSS/BLUE SHIELD | Source: Home / Self Care | Attending: Family Medicine | Admitting: Family Medicine

## 2016-11-24 ENCOUNTER — Encounter: Payer: Self-pay | Admitting: Emergency Medicine

## 2016-11-24 DIAGNOSIS — R6889 Other general symptoms and signs: Secondary | ICD-10-CM

## 2016-11-24 DIAGNOSIS — N61 Mastitis without abscess: Secondary | ICD-10-CM

## 2016-11-24 LAB — POCT INFLUENZA A/B
Influenza A, POC: NEGATIVE
Influenza B, POC: NEGATIVE

## 2016-11-24 MED ORDER — OSELTAMIVIR PHOSPHATE 75 MG PO CAPS: 75.0000 mg | ORAL_CAPSULE | Freq: Two times a day (BID) | ORAL | 0 refills | Status: AC

## 2016-11-24 MED ORDER — CEPHALEXIN 500 MG PO CAPS: 500.0000 mg | ORAL_CAPSULE | Freq: Four times a day (QID) | ORAL | 0 refills | Status: AC

## 2016-11-24 NOTE — ED Triage Notes (Signed)
Patient reports pain in right breast starting yesterday; she is breast feeding full time and it feels like historical mastitis; general aches and fever through night.

## 2016-11-24 NOTE — Discharge Instructions (Signed)
°  Your symptoms of body aches, fever, headache, and nausea could be due to early onset mastitis, or it could be due to early onset influenza. There is a chance of a false negative on the flu test, meaning even though the test was negative, you could still have the flu. If you develop nasal congestion and cough with the body aches, it is more likely you do have the flu, or known exposure to someone with the flu. If you do decide to take the tamiflu, it is best to take within the first 48 hours of symptoms, however, there is also a chance of nausea, vomiting, and diarrhea with the tamiflu.

## 2016-11-24 NOTE — ED Provider Notes (Signed)
CSN: [id number]     Arrival date & time 11/24/16  1105 History   First MD Initiated Contact with Patient 11/24/16 1128     Chief Complaint  Patient presents with  . Breast Pain  . Generalized Body Aches   (Consider location/radiation/quality/duration/timing/severity/associated sxs/prior Treatment) HPI  [name] is a 31 y.o. female presenting to UC with c/o sudden onset generalized headache, Right breast pain and fever of Tmax 101*F last night. Pt has been breastfeeding her 71mo old.  She has a hx of mastitis, pain feels similar to that but she notes she does not have redness and swelling of her breast like last time.  She is also concerned she may have the flu. She has had nausea but no vomiting or diarrhea.  She did not get the flu vaccine this year. Denies cough or congestion.    Past Medical History:  Diagnosis Date  . Abnormal Pap smear 2008, cryo and colpo CIN-2   Past Surgical History:  Procedure Laterality Date  . Negative     Family History  Problem Relation Age of Onset  . Diabetes Maternal Grandmother    Social History  Substance Use Topics  . Smoking status: Never Smoker  . Smokeless tobacco: Never Used  . Alcohol use No   OB History    Gravida Para Term Preterm AB Living   2 2 2     2    SAB TAB Ectopic Multiple Live Births                 Review of Systems  Constitutional: Positive for chills, fatigue and fever.  HENT: Negative for congestion, ear pain, sore throat, trouble swallowing and voice change.   Respiratory: Negative for cough and shortness of breath.   Cardiovascular: Positive for chest pain (Right breast). Negative for palpitations.  Gastrointestinal: Positive for nausea. Negative for abdominal pain, diarrhea and vomiting.  Musculoskeletal: Positive for arthralgias and myalgias. Negative for back pain.       Body aches  Skin: Negative for rash.  Neurological: Positive for headaches ( generalized). Negative for dizziness and  light-headedness.    Allergies  Patient has no known allergies.  Home Medications   Prior to Admission medications   Medication Sig Start Date End Date Taking? Authorizing Provider  cephALEXin (KEFLEX) 500 MG capsule Take 1 capsule (500 mg total) by mouth 4 (four) times daily. 11/24/16   Noland Fordyce, PA-C  Norgestimate-Ethinyl Estradiol Triphasic 0.18/0.215/0.25 MG-35 MCG tablet Take 1 tablet by mouth daily. 02/07/15   Guss Bunde, MD  oseltamivir (TAMIFLU) 75 MG capsule Take 1 capsule (75 mg total) by mouth every 12 (twelve) hours. 11/24/16   Noland Fordyce, PA-C   Meds Ordered and Administered this Visit  Medications - No data to display  BP 115/81 (BP Location: Left Arm)   Pulse 119   Temp 98.7 F (37.1 C) (Oral)   Resp 16   Ht 5\' 5"  (1.651 m)   Wt 190 lb (86.2 kg)   SpO2 98%   BMI 31.62 kg/m  No data found.   Physical Exam  Constitutional: She is oriented to person, place, and time. She appears well-developed and well-nourished. No distress.  HENT:  Head: Normocephalic and atraumatic.  Right Ear: Tympanic membrane normal.  Left Ear: Tympanic membrane normal.  Nose: Nose normal.  Mouth/Throat: Uvula is midline, oropharynx is clear and moist and mucous membranes are normal.  Eyes: EOM are normal.  Neck: Normal range of motion. Neck supple.  Cardiovascular:  Normal rate and regular rhythm.   Pulmonary/Chest: Effort normal and breath sounds normal. No stridor. No respiratory distress. She has no wheezes. She has no rales. She exhibits tenderness.    Right breast tenderness w/o erythema or obvious edema. No nipple discharge.   Musculoskeletal: Normal range of motion.  Lymphadenopathy:    She has no cervical adenopathy.  Neurological: She is alert and oriented to person, place, and time.  Skin: Skin is warm and dry. She is not diaphoretic.  Psychiatric: She has a normal mood and affect. Her behavior is normal.  Nursing note and vitals reviewed.   Urgent Care  Course     Procedures (including critical care time)  Labs Review Labs Reviewed  POCT INFLUENZA A/B    Imaging Review No results found.    MDM   1. Acute mastitis of right breast   2. Flu-like symptoms    Pt presenting to UC with Right breast pain, currently breast feeding. Concern for mastitis. No erythema of skin but there is tenderness to Right breast. Due to hx of same, will start antibiotic treatment- cephalexin.  Pt requested flu test due having 3 young children at home. Rapid flu- Negative Discussed potential of false negative, however, advised pt she does not have classic URI symptoms that typically accompany the flu. Prescription to hold for Tamiflu in case pt develops URI symptoms as symptoms started suddenly last night.  F/u with PCP in 1 week if needed.     Noland Fordyce, PA-C 11/24/16 1228
